# Patient Record
Sex: Female | Born: 1955 | Race: White | Hispanic: No
Health system: Midwestern US, Academic
[De-identification: ages and names within clinical notes are randomized; demographics above are authoritative.]

---

## 2017-09-22 LAB — COMPREHENSIVE METABOLIC PANEL
Lab: 0.5
Lab: 203 — ABNORMAL HIGH (ref 40–150)
Lab: 26
Lab: 4
Lab: 50
Lab: 6.6

## 2017-09-22 LAB — LIPID PROFILE: Lab: 5

## 2018-02-15 LAB — BASIC METABOLIC PANEL
Lab: 101
Lab: 135 — ABNORMAL LOW (ref 137–144)
Lab: 14
Lab: 21 — ABNORMAL LOW (ref 23–31)
Lab: 4.1

## 2018-02-18 LAB — CBC: Lab: 13

## 2018-03-29 ENCOUNTER — Encounter: Admit: 2018-03-29 | Discharge: 2018-03-29 | Payer: MEDICARE

## 2018-03-30 ENCOUNTER — Encounter: Admit: 2018-03-30 | Discharge: 2018-03-30 | Payer: MEDICARE

## 2018-04-01 ENCOUNTER — Encounter: Admit: 2018-04-01 | Discharge: 2018-04-01 | Payer: MEDICARE

## 2018-04-01 DIAGNOSIS — R931 Abnormal findings on diagnostic imaging of heart and coronary circulation: Secondary | ICD-10-CM

## 2018-04-04 ENCOUNTER — Encounter: Admit: 2018-04-04 | Discharge: 2018-04-04 | Payer: MEDICARE

## 2018-04-04 DIAGNOSIS — M549 Dorsalgia, unspecified: Secondary | ICD-10-CM

## 2018-04-04 DIAGNOSIS — R32 Unspecified urinary incontinence: Secondary | ICD-10-CM

## 2018-04-04 DIAGNOSIS — G35 Multiple sclerosis: Secondary | ICD-10-CM

## 2018-04-04 DIAGNOSIS — M1990 Unspecified osteoarthritis, unspecified site: Secondary | ICD-10-CM

## 2018-04-04 DIAGNOSIS — F329 Major depressive disorder, single episode, unspecified: Secondary | ICD-10-CM

## 2018-04-04 DIAGNOSIS — G4700 Insomnia, unspecified: Secondary | ICD-10-CM

## 2018-04-04 DIAGNOSIS — R931 Abnormal findings on diagnostic imaging of heart and coronary circulation: Secondary | ICD-10-CM

## 2018-04-04 DIAGNOSIS — G2581 Restless legs syndrome: Secondary | ICD-10-CM

## 2018-04-04 DIAGNOSIS — H269 Unspecified cataract: Secondary | ICD-10-CM

## 2018-04-04 DIAGNOSIS — K219 Gastro-esophageal reflux disease without esophagitis: Secondary | ICD-10-CM

## 2018-04-04 DIAGNOSIS — E039 Hypothyroidism, unspecified: Secondary | ICD-10-CM

## 2018-04-07 ENCOUNTER — Encounter: Admit: 2018-04-07 | Discharge: 2018-04-07 | Payer: MEDICARE

## 2018-04-07 ENCOUNTER — Ambulatory Visit: Admit: 2018-04-07 | Discharge: 2018-04-07 | Payer: MEDICARE

## 2018-04-07 DIAGNOSIS — H269 Unspecified cataract: Secondary | ICD-10-CM

## 2018-04-07 DIAGNOSIS — R931 Abnormal findings on diagnostic imaging of heart and coronary circulation: Secondary | ICD-10-CM

## 2018-04-07 DIAGNOSIS — G2581 Restless legs syndrome: Secondary | ICD-10-CM

## 2018-04-07 DIAGNOSIS — G4700 Insomnia, unspecified: Secondary | ICD-10-CM

## 2018-04-07 DIAGNOSIS — R32 Unspecified urinary incontinence: Secondary | ICD-10-CM

## 2018-04-07 DIAGNOSIS — M549 Dorsalgia, unspecified: Secondary | ICD-10-CM

## 2018-04-07 DIAGNOSIS — G35 Multiple sclerosis: Secondary | ICD-10-CM

## 2018-04-07 DIAGNOSIS — E039 Hypothyroidism, unspecified: Secondary | ICD-10-CM

## 2018-04-07 DIAGNOSIS — F329 Major depressive disorder, single episode, unspecified: Secondary | ICD-10-CM

## 2018-04-07 DIAGNOSIS — M1990 Unspecified osteoarthritis, unspecified site: Secondary | ICD-10-CM

## 2018-04-07 DIAGNOSIS — K219 Gastro-esophageal reflux disease without esophagitis: Secondary | ICD-10-CM

## 2018-04-08 ENCOUNTER — Encounter: Admit: 2018-04-08 | Discharge: 2018-04-08 | Payer: MEDICARE

## 2018-04-13 ENCOUNTER — Ambulatory Visit: Admit: 2018-04-13 | Discharge: 2018-04-14 | Payer: MEDICARE

## 2018-04-13 DIAGNOSIS — R931 Abnormal findings on diagnostic imaging of heart and coronary circulation: Secondary | ICD-10-CM

## 2018-04-13 MED ORDER — SODIUM CHLORIDE 0.9 % IV SOLP
250 mL | INTRAVENOUS | 0 refills | Status: AC | PRN
Start: 2018-04-13 — End: ?

## 2018-04-13 MED ORDER — AMINOPHYLLINE 500 MG/20 ML IV SOLN
50 mg | INTRAVENOUS | 0 refills | Status: AC | PRN
Start: 2018-04-13 — End: ?

## 2018-04-13 MED ORDER — REGADENOSON 0.4 MG/5 ML IV SYRG
.4 mg | Freq: Once | INTRAVENOUS | 0 refills | Status: CP
Start: 2018-04-13 — End: ?

## 2018-04-26 ENCOUNTER — Encounter: Admit: 2018-04-26 | Discharge: 2018-04-26 | Payer: MEDICARE

## 2018-04-26 NOTE — Telephone Encounter
Spoke with pt and she was given the information as below.

## 2018-04-26 NOTE — Telephone Encounter
-----   Message from Huel Coventry, RN sent at 04/26/2018 10:54 AM CST -----    ----- Message -----  From: Laurence Aly, MD  Sent: 04/25/2018   6:14 PM CST  To: Renetta Chalk, RN, Huel Coventry, RN    Let pt know this looks normal and be sure we have follow up arranged.

## 2018-06-24 ENCOUNTER — Encounter: Admit: 2018-06-24 | Discharge: 2018-06-24 | Payer: MEDICARE

## 2018-06-29 ENCOUNTER — Encounter: Admit: 2018-06-29 | Discharge: 2018-06-29 | Payer: MEDICARE

## 2018-06-30 ENCOUNTER — Encounter: Admit: 2018-06-30 | Discharge: 2018-06-30 | Payer: MEDICARE

## 2018-07-01 ENCOUNTER — Encounter: Admit: 2018-07-01 | Discharge: 2018-07-01 | Payer: MEDICARE

## 2018-07-01 DIAGNOSIS — R32 Unspecified urinary incontinence: ICD-10-CM

## 2018-07-01 DIAGNOSIS — F329 Major depressive disorder, single episode, unspecified: ICD-10-CM

## 2018-07-01 DIAGNOSIS — I2510 Atherosclerotic heart disease of native coronary artery without angina pectoris: ICD-10-CM

## 2018-07-01 DIAGNOSIS — K219 Gastro-esophageal reflux disease without esophagitis: ICD-10-CM

## 2018-07-01 DIAGNOSIS — R931 Abnormal findings on diagnostic imaging of heart and coronary circulation: Principal | ICD-10-CM

## 2018-07-01 DIAGNOSIS — H269 Unspecified cataract: ICD-10-CM

## 2018-07-01 DIAGNOSIS — Z79899 Other long term (current) drug therapy: Secondary | ICD-10-CM

## 2018-07-01 DIAGNOSIS — G35 Multiple sclerosis: ICD-10-CM

## 2018-07-01 DIAGNOSIS — G2581 Restless legs syndrome: ICD-10-CM

## 2018-07-01 DIAGNOSIS — M1990 Unspecified osteoarthritis, unspecified site: ICD-10-CM

## 2018-07-01 DIAGNOSIS — G4700 Insomnia, unspecified: ICD-10-CM

## 2018-07-01 DIAGNOSIS — M549 Dorsalgia, unspecified: ICD-10-CM

## 2018-07-01 DIAGNOSIS — E039 Hypothyroidism, unspecified: ICD-10-CM

## 2018-07-01 DIAGNOSIS — E782 Mixed hyperlipidemia: ICD-10-CM

## 2018-07-01 MED ORDER — PRALUENT PEN 150 MG/ML SC PNIJ
150 mg | SUBCUTANEOUS | 11 refills | 28.00000 days | Status: DC
Start: 2018-07-01 — End: 2019-06-22
  Filled 2018-07-05 (×2): qty 2, 84d supply, fill #1

## 2018-07-01 MED ORDER — REPATHA SURECLICK 140 MG/ML SC PNIJ
140 mg | SUBCUTANEOUS | 11 refills | 28.00000 days | Status: DC
Start: 2018-07-01 — End: 2018-07-01

## 2018-07-02 ENCOUNTER — Ambulatory Visit: Admit: 2018-07-01 | Discharge: 2018-07-02 | Payer: MEDICARE

## 2018-07-02 DIAGNOSIS — Z7982 Long term (current) use of aspirin: ICD-10-CM

## 2018-07-02 DIAGNOSIS — I2584 Coronary atherosclerosis due to calcified coronary lesion: Secondary | ICD-10-CM

## 2018-07-02 DIAGNOSIS — Z720 Tobacco use: ICD-10-CM

## 2018-07-02 DIAGNOSIS — R931 Abnormal findings on diagnostic imaging of heart and coronary circulation: ICD-10-CM

## 2018-07-02 DIAGNOSIS — I2510 Atherosclerotic heart disease of native coronary artery without angina pectoris: Principal | ICD-10-CM

## 2018-07-04 ENCOUNTER — Encounter: Admit: 2018-07-04 | Discharge: 2018-07-04 | Payer: MEDICARE

## 2018-07-04 NOTE — Progress Notes
The Prior Authorization for Praluent was submitted for Nicole Hopkins via [facility].  Will continue to follow.    Maree Erie  Pharmacy Patient Advocate  [phone]

## 2018-07-04 NOTE — Telephone Encounter
Allergy symptoms.   cholecalciferol (VITAMIN D-3) 2,000 unit tablet Take 2,000 Units by mouth daily.   fingolimod (GILENYA) 0.5 mg Take 0.5 mg by mouth daily.   fluticasone propionate (FLONASE) 50 mcg/actuation nasal spray, suspension Apply 2 sprays to each nostril as directed as Needed. Shake bottle gently before using.    hydroCHLOROthiazide (HYDRODIURIL) 12.5 mg tablet Take 12.5 mg by mouth every morning.   levothyroxine (SYNTHROID) 150 mcg tablet Take 150 mcg by mouth daily 30 minutes before breakfast.   lisinopril (PRINIVIL; ZESTRIL) 20 mg tablet Take 20 mg by mouth daily.   MULTIVITAMIN PO Take 1 tablet by mouth daily.   pantoprazole DR (PROTONIX) 40 mg tablet Take 40 mg by mouth daily.   pramipexole (MIRAPEX) 0.5 mg tablet Take 0.5 mg by mouth three times daily.        Drug-drug and drug-food interactions with the new therapy were assessed and reviewed with the patient. The patient was instructed to speak with their health care provider before starting any new drug, including prescription or over the counter, natural products, or vitamins.    Pregnancy potential was reviewed with the patient. Female, not of child-bearing potential: education not applicable.     The monitoring and follow-up plan was discussed with the patient. The patient was instructed to contact their health care provider if their symptoms or health problems do not get better or if they become worse. MACHEL VIOLANTE was instructed to contact the clinical assessment pharmacy at [phone] if they have any questions or concerns regarding their medication therapy.     Based on the patients' preference the medication(s) will be picked up or shipped from [facility].     EYVA CALIFANO was given the opportunity to ask questions and did not have any questions at this time.    Ladona Mow, PHARMD

## 2018-07-22 ENCOUNTER — Encounter: Admit: 2018-07-22 | Discharge: 2018-07-22 | Payer: MEDICARE

## 2018-09-12 ENCOUNTER — Encounter: Admit: 2018-09-12 | Discharge: 2018-09-12

## 2018-09-12 MED FILL — PRALUENT PEN 150 MG/ML SC PNIJ: 150 mg/mL | SUBCUTANEOUS | 84 days supply | Qty: 2 | Fill #2 | Status: AC

## 2018-11-30 ENCOUNTER — Encounter: Admit: 2018-11-30 | Discharge: 2018-11-30

## 2018-12-01 MED FILL — PRALUENT PEN 150 MG/ML SC PNIJ: 150 mg/mL | SUBCUTANEOUS | 84 days supply | Qty: 2 | Fill #3 | Status: AC

## 2019-01-12 ENCOUNTER — Encounter: Admit: 2019-01-12 | Discharge: 2019-01-12 | Payer: MEDICARE

## 2019-01-12 NOTE — Progress Notes
Records Request    Medical records request for continuation of care:    Patient has appointment on 01/18/19   with Dr. Aris Georgia.    Please fax records to [facility]    Request records:    Recent Labs (lipid)     Thank you,      Cardiovascular Medicine  [facility]  [address] [place] [address]  Phone:  [phone]  Fax:  [phone]

## 2019-01-26 ENCOUNTER — Encounter: Admit: 2019-01-26 | Discharge: 2019-01-26 | Payer: MEDICARE

## 2019-01-26 DIAGNOSIS — R609 Edema, unspecified: Secondary | ICD-10-CM

## 2019-01-26 DIAGNOSIS — R931 Abnormal findings on diagnostic imaging of heart and coronary circulation: Secondary | ICD-10-CM

## 2019-01-26 DIAGNOSIS — I2510 Atherosclerotic heart disease of native coronary artery without angina pectoris: Secondary | ICD-10-CM

## 2019-01-26 DIAGNOSIS — I10 Essential (primary) hypertension: Secondary | ICD-10-CM

## 2019-01-26 DIAGNOSIS — G2581 Restless legs syndrome: Secondary | ICD-10-CM

## 2019-01-26 NOTE — Telephone Encounter
-----   Message from Rosaria Ferries, LPN sent at 32/11/1914 11:55 AM CST -----  Regarding: Echo / Appt with TLR  Patient called today 01/26/2019 requesting to have an Echocardiogram. Patient stated that PCP / Fidela Juneau PA would like for patient to have Echo due to leg swelling. Patient is scheduled to follow up with Dr. Fabio Neighbors on 02/06/2019 at [place]. No current order for Echo is placed in patient's chart. Thank you!

## 2019-01-26 NOTE — Telephone Encounter
Reviewed with TLR orders received for echo

## 2019-01-30 ENCOUNTER — Encounter: Admit: 2019-01-30 | Discharge: 2019-01-30 | Payer: MEDICARE

## 2019-02-01 ENCOUNTER — Encounter: Admit: 2019-02-01 | Discharge: 2019-02-01 | Payer: MEDICARE

## 2019-02-01 DIAGNOSIS — E782 Mixed hyperlipidemia: Secondary | ICD-10-CM

## 2019-02-03 ENCOUNTER — Encounter: Admit: 2019-02-03 | Discharge: 2019-02-03 | Payer: MEDICARE

## 2019-02-03 NOTE — Telephone Encounter
Results sent through MyChart.

## 2019-02-03 NOTE — Telephone Encounter
[place], Nicole Nottingham, APRN-NP  Alfonse Ras, RN        This is amazing! Please let her know how much progress we have made!

## 2019-02-06 ENCOUNTER — Encounter: Admit: 2019-02-06 | Discharge: 2019-02-06 | Payer: MEDICARE

## 2019-02-06 ENCOUNTER — Ambulatory Visit: Admit: 2019-02-06 | Discharge: 2019-02-06 | Payer: MEDICARE

## 2019-02-06 DIAGNOSIS — M549 Dorsalgia, unspecified: Secondary | ICD-10-CM

## 2019-02-06 DIAGNOSIS — G35 Multiple sclerosis: Secondary | ICD-10-CM

## 2019-02-06 DIAGNOSIS — G4700 Insomnia, unspecified: Secondary | ICD-10-CM

## 2019-02-06 DIAGNOSIS — G2581 Restless legs syndrome: Secondary | ICD-10-CM

## 2019-02-06 DIAGNOSIS — I2510 Atherosclerotic heart disease of native coronary artery without angina pectoris: Secondary | ICD-10-CM

## 2019-02-06 DIAGNOSIS — M1990 Unspecified osteoarthritis, unspecified site: Secondary | ICD-10-CM

## 2019-02-06 DIAGNOSIS — F329 Major depressive disorder, single episode, unspecified: Secondary | ICD-10-CM

## 2019-02-06 DIAGNOSIS — R931 Abnormal findings on diagnostic imaging of heart and coronary circulation: Secondary | ICD-10-CM

## 2019-02-06 DIAGNOSIS — I10 Essential (primary) hypertension: Secondary | ICD-10-CM

## 2019-02-06 DIAGNOSIS — E782 Mixed hyperlipidemia: Secondary | ICD-10-CM

## 2019-02-06 DIAGNOSIS — E039 Hypothyroidism, unspecified: Secondary | ICD-10-CM

## 2019-02-06 DIAGNOSIS — R32 Unspecified urinary incontinence: Secondary | ICD-10-CM

## 2019-02-06 DIAGNOSIS — R609 Edema, unspecified: Secondary | ICD-10-CM

## 2019-02-06 DIAGNOSIS — H269 Unspecified cataract: Secondary | ICD-10-CM

## 2019-02-06 DIAGNOSIS — K219 Gastro-esophageal reflux disease without esophagitis: Secondary | ICD-10-CM

## 2019-02-06 MED ORDER — PERFLUTREN LIPID MICROSPHERES 1.1 MG/ML IV SUSP
1-20 mL | Freq: Once | INTRAVENOUS | 0 refills | Status: CP | PRN
Start: 2019-02-06 — End: ?

## 2019-02-10 ENCOUNTER — Encounter: Admit: 2019-02-10 | Discharge: 2019-02-10 | Payer: MEDICARE

## 2019-02-10 NOTE — Telephone Encounter
02/10/2019 10:57 AM Patient called to provide update on daily weights after Lasix was increased from 40 mg daily to 80 mg daily X 3 days at [facility] on 02/06/19 with Dr. Aris Georgia.  Potassium was also increased to 40 mEq X 3 days.  Today, patient confirms she is back on her prescribed dose of Lasix 40 mEq daily and potassium 20 mEq daily.  At [place], patient was up 3-5 lbs from her baseline weight and had 1+ edema in BLE.    Daily home weights:    11/16 182.8  (186 lb, 6.4 oz in clinic)  11/17 181.8  11/18 180.8  11/19 180.4  11/20 180.1    Patient lost almost three pounds on the increased dose of Lasix.  She reports her swelling is resolved.  Her leg wraps are now replaced by compression stockings.    Reviewed all above with Dr. Aris Georgia.  Will continue to monitor weights.  Asked patient to keep a BP and HR log, along with daily weights.  Call back in two weeks with readings.    Called patent back to relay Dr. Loletta Parish recommendations.  Patient's BP monitor is not working.  Her mother will stop by the clinic after work to pick up a new one.  Patient will take her blood pressure 1-2 hours after morning medications and after sitting/resting for 10-15 mintues.  Patient will call in two weeks with BP, HR, and daily weights.

## 2019-02-14 ENCOUNTER — Encounter: Admit: 2019-02-14 | Discharge: 2019-02-14 | Payer: MEDICARE

## 2019-02-14 NOTE — Telephone Encounter
Results and recommendations called to patient.

## 2019-02-14 NOTE — Telephone Encounter
-----   Message from Binnie Kand, RN sent at 02/13/2019  6:16 PM CST -----    ----- Message -----  From: Shan Levans, MD  Sent: 02/11/2019   8:44 AM CST  To: Binnie Kand, RN    LET PT KNOW THI SLOOKS NORMAL.

## 2019-03-03 ENCOUNTER — Encounter: Admit: 2019-03-03 | Discharge: 2019-03-03 | Payer: MEDICARE

## 2019-03-06 ENCOUNTER — Encounter: Admit: 2019-03-06 | Discharge: 2019-03-06 | Payer: MEDICARE

## 2019-03-07 ENCOUNTER — Encounter: Admit: 2019-03-07 | Discharge: 2019-03-07 | Payer: MEDICARE

## 2019-03-07 DIAGNOSIS — I2510 Atherosclerotic heart disease of native coronary artery without angina pectoris: Secondary | ICD-10-CM

## 2019-03-07 DIAGNOSIS — R600 Localized edema: Secondary | ICD-10-CM

## 2019-03-07 MED ORDER — FUROSEMIDE 40 MG PO TAB
ORAL_TABLET | Freq: Every day | ORAL | 3 refills | 90.00000 days | Status: DC
Start: 2019-03-07 — End: 2019-08-16

## 2019-03-07 MED FILL — PRALUENT PEN 150 MG/ML SC PNIJ: 150 mg/mL | SUBCUTANEOUS | 84 days supply | Qty: 2 | Fill #4 | Status: AC

## 2019-03-07 NOTE — Telephone Encounter
Patient called with concerns of lower leg edema. Patient saw TLR on 02-06-2019 in clinic with similar symptoms. At that time she was directed to take Lasix 80mg  daily and potassium 40 meq daily for 3 days. Patient would like to know if she can do this again.   TLR consulted via phone and he agrees with this plan. He would also like to have BNP and BMP drawn in a few days.   Orders entered and patient notified.

## 2019-03-10 ENCOUNTER — Encounter: Admit: 2019-03-10 | Discharge: 2019-03-10 | Payer: MEDICARE

## 2019-03-10 DIAGNOSIS — I2510 Atherosclerotic heart disease of native coronary artery without angina pectoris: Secondary | ICD-10-CM

## 2019-03-10 DIAGNOSIS — R600 Localized edema: Secondary | ICD-10-CM

## 2019-03-12 ENCOUNTER — Encounter: Admit: 2019-03-12 | Discharge: 2019-03-12 | Payer: MEDICARE

## 2019-04-06 ENCOUNTER — Encounter: Admit: 2019-04-06 | Discharge: 2019-04-06 | Payer: MEDICARE

## 2019-04-07 ENCOUNTER — Encounter: Admit: 2019-04-07 | Discharge: 2019-04-07 | Payer: MEDICARE

## 2019-04-11 ENCOUNTER — Encounter: Admit: 2019-04-11 | Discharge: 2019-04-11 | Payer: MEDICARE

## 2019-04-11 MED FILL — PRALUENT PEN 150 MG/ML SC PNIJ: 150 mg/mL | SUBCUTANEOUS | 28 days supply | Qty: 2 | Fill #5 | Status: AC

## 2019-05-05 ENCOUNTER — Encounter: Admit: 2019-05-05 | Discharge: 2019-05-05 | Payer: MEDICARE

## 2019-05-08 ENCOUNTER — Encounter: Admit: 2019-05-08 | Discharge: 2019-05-08 | Payer: MEDICARE

## 2019-05-09 ENCOUNTER — Encounter: Admit: 2019-05-09 | Discharge: 2019-05-09 | Payer: MEDICARE

## 2019-05-25 ENCOUNTER — Encounter: Admit: 2019-05-25 | Discharge: 2019-05-25 | Payer: MEDICARE

## 2019-06-05 ENCOUNTER — Encounter: Admit: 2019-06-05 | Discharge: 2019-06-05 | Payer: MEDICARE

## 2019-06-05 MED FILL — PRALUENT PEN 150 MG/ML SC PNIJ: 150 mg/mL | SUBCUTANEOUS | 28 days supply | Qty: 2 | Fill #6 | Status: AC

## 2019-06-14 ENCOUNTER — Encounter: Admit: 2019-06-14 | Discharge: 2019-06-14 | Payer: MEDICARE

## 2019-06-22 ENCOUNTER — Encounter: Admit: 2019-06-22 | Discharge: 2019-06-22 | Payer: MEDICARE

## 2019-06-22 MED ORDER — PRALUENT PEN 150 MG/ML SC PNIJ
150 mg | SUBCUTANEOUS | 11 refills | 28.00000 days | Status: AC
Start: 2019-06-22 — End: ?
  Filled 2019-07-06: qty 2, 28d supply, fill #1

## 2019-06-23 ENCOUNTER — Encounter: Admit: 2019-06-23 | Discharge: 2019-06-23 | Payer: MEDICARE

## 2019-07-06 ENCOUNTER — Encounter: Admit: 2019-07-06 | Discharge: 2019-07-06 | Payer: MEDICARE

## 2019-08-08 ENCOUNTER — Encounter: Admit: 2019-08-08 | Discharge: 2019-08-08 | Payer: MEDICARE

## 2019-08-09 MED FILL — PRALUENT PEN 150 MG/ML SC PNIJ: 150 mg/mL | SUBCUTANEOUS | 28 days supply | Qty: 2 | Fill #2 | Status: AC

## 2019-08-16 ENCOUNTER — Encounter: Admit: 2019-08-16 | Discharge: 2019-08-16 | Payer: MEDICARE

## 2019-08-16 DIAGNOSIS — G4700 Insomnia, unspecified: Secondary | ICD-10-CM

## 2019-08-16 DIAGNOSIS — I2510 Atherosclerotic heart disease of native coronary artery without angina pectoris: Secondary | ICD-10-CM

## 2019-08-16 DIAGNOSIS — E782 Mixed hyperlipidemia: Secondary | ICD-10-CM

## 2019-08-16 DIAGNOSIS — I10 Essential (primary) hypertension: Secondary | ICD-10-CM

## 2019-08-16 DIAGNOSIS — M1990 Unspecified osteoarthritis, unspecified site: Secondary | ICD-10-CM

## 2019-08-16 DIAGNOSIS — M549 Dorsalgia, unspecified: Secondary | ICD-10-CM

## 2019-08-16 DIAGNOSIS — K219 Gastro-esophageal reflux disease without esophagitis: Secondary | ICD-10-CM

## 2019-08-16 DIAGNOSIS — R931 Abnormal findings on diagnostic imaging of heart and coronary circulation: Secondary | ICD-10-CM

## 2019-08-16 DIAGNOSIS — E039 Hypothyroidism, unspecified: Secondary | ICD-10-CM

## 2019-08-16 DIAGNOSIS — R32 Unspecified urinary incontinence: Secondary | ICD-10-CM

## 2019-08-16 DIAGNOSIS — G2581 Restless legs syndrome: Secondary | ICD-10-CM

## 2019-08-16 DIAGNOSIS — F329 Major depressive disorder, single episode, unspecified: Secondary | ICD-10-CM

## 2019-08-16 DIAGNOSIS — G35 Multiple sclerosis: Secondary | ICD-10-CM

## 2019-08-16 DIAGNOSIS — H269 Unspecified cataract: Secondary | ICD-10-CM

## 2019-08-16 MED ORDER — FUROSEMIDE 80 MG PO TAB
80 mg | ORAL_TABLET | Freq: Every day | ORAL | 3 refills | 90.00000 days | Status: AC
Start: 2019-08-16 — End: ?

## 2019-08-16 MED ORDER — POTASSIUM CHLORIDE 20 MEQ PO TBER
40 meq | ORAL_TABLET | Freq: Every day | ORAL | 3 refills | 30.00000 days | Status: AC
Start: 2019-08-16 — End: ?

## 2019-08-16 NOTE — Patient Instructions
It was a pleasure to see you in clinic today.  Please call our office at [phone] with any questions or concerns.    Changes today:  Increase Lasix to 80mg  daily  Increase Potassium Chloride to daily    Ordered testing:  BMP, BNP to completed next week    Follow-up:  Dr. Geronimo Boot will follow up with you in 6 months

## 2019-09-13 ENCOUNTER — Encounter: Admit: 2019-09-13 | Discharge: 2019-09-13 | Payer: MEDICARE

## 2019-09-13 DIAGNOSIS — I10 Essential (primary) hypertension: Secondary | ICD-10-CM

## 2019-09-13 DIAGNOSIS — E782 Mixed hyperlipidemia: Secondary | ICD-10-CM

## 2019-09-13 DIAGNOSIS — I2510 Atherosclerotic heart disease of native coronary artery without angina pectoris: Secondary | ICD-10-CM

## 2019-09-14 ENCOUNTER — Encounter: Admit: 2019-09-14 | Discharge: 2019-09-14 | Payer: MEDICARE

## 2019-09-14 MED FILL — PRALUENT PEN 150 MG/ML SC PNIJ: 150 mg/mL | SUBCUTANEOUS | 28 days supply | Qty: 2 | Fill #3 | Status: AC

## 2019-10-11 ENCOUNTER — Encounter: Admit: 2019-10-11 | Discharge: 2019-10-11 | Payer: MEDICARE

## 2019-10-11 MED FILL — PRALUENT PEN 150 MG/ML SC PNIJ: 150 mg/mL | SUBCUTANEOUS | 28 days supply | Qty: 2 | Fill #4 | Status: AC

## 2019-11-07 ENCOUNTER — Encounter: Admit: 2019-11-07 | Discharge: 2019-11-07 | Payer: MEDICARE

## 2019-11-13 ENCOUNTER — Encounter: Admit: 2019-11-13 | Discharge: 2019-11-13 | Payer: MEDICARE

## 2019-11-13 MED ORDER — METOLAZONE 2.5 MG PO TAB
2.5 mg | ORAL_TABLET | Freq: Every day | ORAL | 0 refills | 84.00000 days | Status: AC
Start: 2019-11-13 — End: ?

## 2019-11-13 MED ORDER — POTASSIUM CHLORIDE 20 MEQ PO TBER
40 meq | ORAL_TABLET | Freq: Every day | ORAL | 3 refills | 30.00000 days | Status: AC
Start: 2019-11-13 — End: ?

## 2019-11-14 ENCOUNTER — Encounter: Admit: 2019-11-14 | Discharge: 2019-11-14 | Payer: MEDICARE

## 2019-11-15 ENCOUNTER — Encounter: Admit: 2019-11-15 | Discharge: 2019-11-15 | Payer: MEDICARE

## 2019-11-15 MED FILL — PRALUENT PEN 150 MG/ML SC PNIJ: 150 mg/mL | SUBCUTANEOUS | 28 days supply | Qty: 2 | Fill #5 | Status: AC

## 2019-12-05 ENCOUNTER — Encounter: Admit: 2019-12-05 | Discharge: 2019-12-05 | Payer: MEDICARE

## 2019-12-05 MED ORDER — FUROSEMIDE 80 MG PO TAB
80 mg | ORAL_TABLET | Freq: Every day | ORAL | 3 refills | 90.00000 days | Status: AC
Start: 2019-12-05 — End: ?

## 2019-12-18 ENCOUNTER — Encounter: Admit: 2019-12-18 | Discharge: 2019-12-18 | Payer: MEDICARE

## 2019-12-18 MED FILL — PRALUENT PEN 150 MG/ML SC PNIJ: 150 mg/mL | SUBCUTANEOUS | 28 days supply | Qty: 2 | Fill #6 | Status: AC

## 2019-12-22 ENCOUNTER — Encounter: Admit: 2019-12-22 | Discharge: 2019-12-22 | Payer: MEDICARE

## 2019-12-22 DIAGNOSIS — R609 Edema, unspecified: Secondary | ICD-10-CM

## 2019-12-22 MED ORDER — METOLAZONE 2.5 MG PO TAB
2.5 mg | ORAL_TABLET | Freq: Every day | ORAL | 0 refills | 84.00000 days | Status: AC
Start: 2019-12-22 — End: ?

## 2019-12-22 NOTE — Telephone Encounter
Patient called with concerns of increased bilat lower extremity edema. Patient is requesting to resume bumex as she took a couple of months ago when she had swelling in her feet and legs. Patient states at that time she thought the bumex was causing her to have dizziness but now she thinks it was because she was also having sinus problems.     Discussed with TLR in clinic. His recommendations are for bumex 2.5mg  daily for 7 days and BMP in 1 week.     Discussed recommendations with patient. Patient verbalized understanding and agrees with plan.

## 2020-01-18 ENCOUNTER — Encounter: Admit: 2020-01-18 | Discharge: 2020-01-18 | Payer: MEDICARE

## 2020-01-29 ENCOUNTER — Encounter: Admit: 2020-01-29 | Discharge: 2020-01-29 | Payer: MEDICARE

## 2020-01-29 MED FILL — PRALUENT PEN 150 MG/ML SC PNIJ: 150 mg/mL | SUBCUTANEOUS | 28 days supply | Qty: 2 | Fill #7 | Status: AC

## 2020-04-05 ENCOUNTER — Encounter: Admit: 2020-04-05 | Discharge: 2020-04-05 | Payer: MEDICARE | Primary: Primary Care

## 2020-04-05 DIAGNOSIS — R32 Unspecified urinary incontinence: Secondary | ICD-10-CM

## 2020-04-05 DIAGNOSIS — R931 Abnormal findings on diagnostic imaging of heart and coronary circulation: Secondary | ICD-10-CM

## 2020-04-05 DIAGNOSIS — G2581 Restless legs syndrome: Secondary | ICD-10-CM

## 2020-04-05 DIAGNOSIS — E039 Hypothyroidism, unspecified: Secondary | ICD-10-CM

## 2020-04-05 DIAGNOSIS — K219 Gastro-esophageal reflux disease without esophagitis: Secondary | ICD-10-CM

## 2020-04-05 DIAGNOSIS — G35 Multiple sclerosis: Secondary | ICD-10-CM

## 2020-04-05 DIAGNOSIS — G4700 Insomnia, unspecified: Secondary | ICD-10-CM

## 2020-04-05 DIAGNOSIS — M1990 Unspecified osteoarthritis, unspecified site: Secondary | ICD-10-CM

## 2020-04-05 DIAGNOSIS — E782 Mixed hyperlipidemia: Secondary | ICD-10-CM

## 2020-04-05 DIAGNOSIS — I2510 Atherosclerotic heart disease of native coronary artery without angina pectoris: Secondary | ICD-10-CM

## 2020-04-05 DIAGNOSIS — F32A Depression: Secondary | ICD-10-CM

## 2020-04-05 DIAGNOSIS — H269 Unspecified cataract: Secondary | ICD-10-CM

## 2020-04-05 DIAGNOSIS — M549 Dorsalgia, unspecified: Secondary | ICD-10-CM

## 2020-04-05 MED ORDER — POTASSIUM CHLORIDE 20 MEQ PO TBER
40 meq | ORAL_TABLET | Freq: Every day | ORAL | 3 refills | 30.00000 days | Status: AC
Start: 2020-04-05 — End: ?

## 2020-04-10 ENCOUNTER — Encounter: Admit: 2020-04-10 | Discharge: 2020-04-10 | Payer: MEDICARE | Primary: Primary Care

## 2020-04-10 MED FILL — PRALUENT PEN 150 MG/ML SC PNIJ: 150 mg/mL | SUBCUTANEOUS | 28 days supply | Qty: 2 | Fill #8 | Status: AC

## 2020-05-13 MED FILL — PRALUENT PEN 150 MG/ML SC PNIJ: 150 mg/mL | SUBCUTANEOUS | 28 days supply | Qty: 2 | Fill #9 | Status: AC

## 2020-06-17 ENCOUNTER — Encounter: Admit: 2020-06-17 | Discharge: 2020-06-17 | Payer: MEDICARE | Primary: Primary Care

## 2020-06-17 MED FILL — PRALUENT PEN 150 MG/ML SC PNIJ: 150 mg/mL | SUBCUTANEOUS | 28 days supply | Qty: 2 | Fill #10 | Status: AC

## 2020-06-17 NOTE — Progress Notes
Copay assistance of $2500 was obtained for the specialty medication praluent using grant from [facility] and now the copay is $0.  Nicole Hopkins has stated this copay is affordable.  The specialty pharmacy will pursue additional copay assistance as necessary.  The specialty pharmacy will reach out to the ambulatory clinical pharmacist and oral chemotherapy pharmacist if the copay becomes unaffordable.    The medication will be delivered to patient's prescription address per the patient's request.    Monica Becton  Pharmacy Patient Advocate  [phone]

## 2020-06-17 NOTE — Progress Notes
Contacted [facility] to discuss [employer] regarding their medication: praluent.  Left voicemail asking patient to return call to the specialty pharmacy [phone]).  Will call patient again in 2 business days if no call back received.Marland Kitchen    Monica Becton, CPhT  Pharmacy Patient Advocate, Specialty Pharmacy  [phone]

## 2020-08-28 ENCOUNTER — Encounter: Admit: 2020-08-28 | Discharge: 2020-08-28 | Payer: MEDICARE | Primary: Primary Care

## 2020-08-28 MED ORDER — PRALUENT PEN 150 MG/ML SC PNIJ
150 mg | SUBCUTANEOUS | 11 refills | 28.00000 days | Status: AC
Start: 2020-08-28 — End: ?
  Filled 2020-08-29: qty 2, 28d supply, fill #1

## 2020-08-29 IMAGING — MG MAMMOGRAM, DIGITAL SCREEN BILA
1 series · 4 of 4 positions shown · non-contrast
Comparison: none

[Series 2: R CC · right · 4 of 4 slices shown]
[im 1/4]
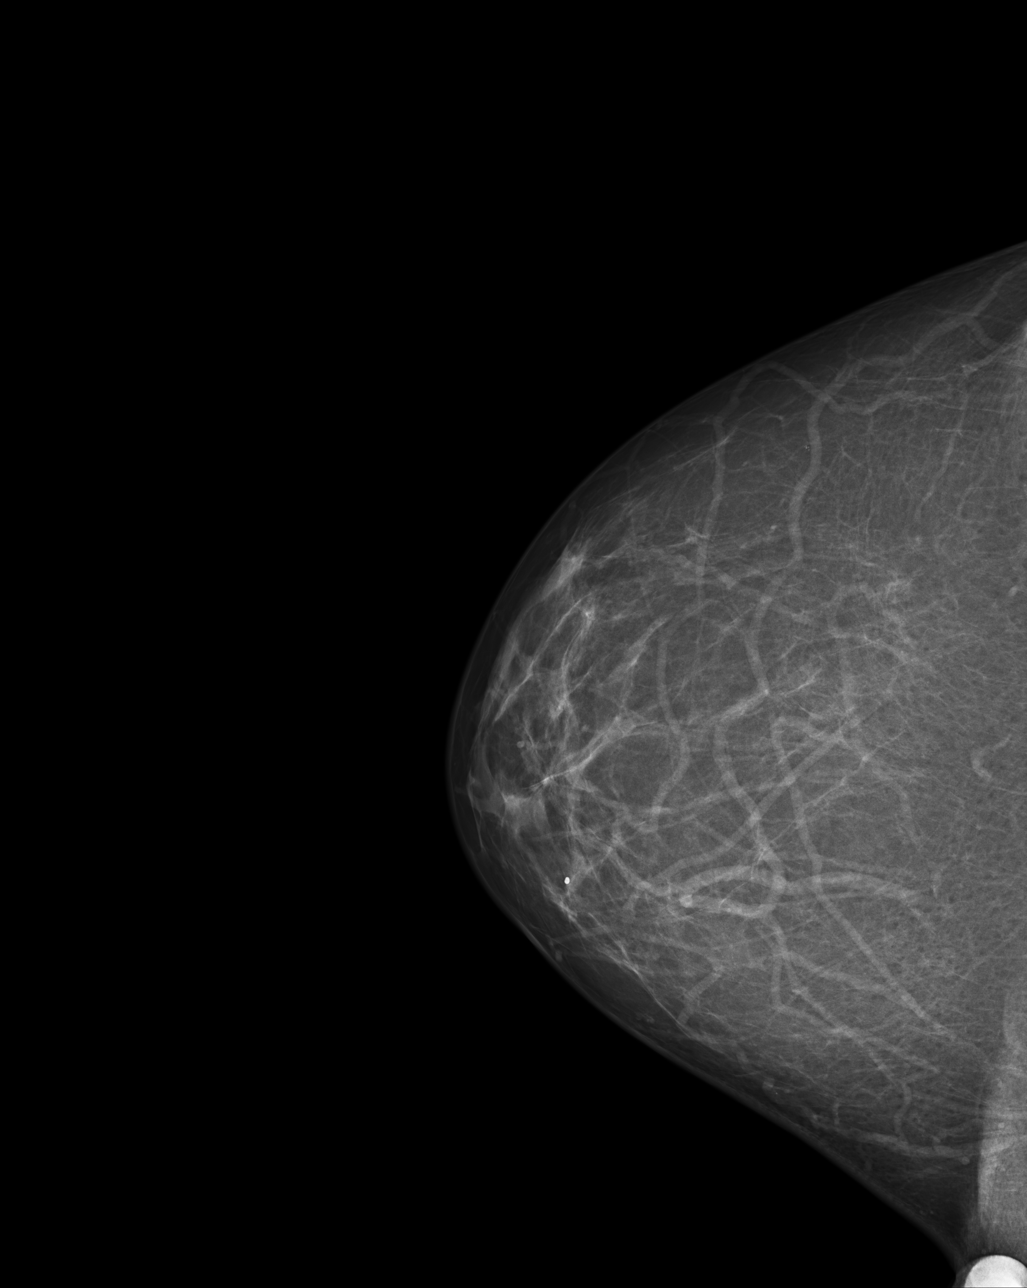
[im 2/4]
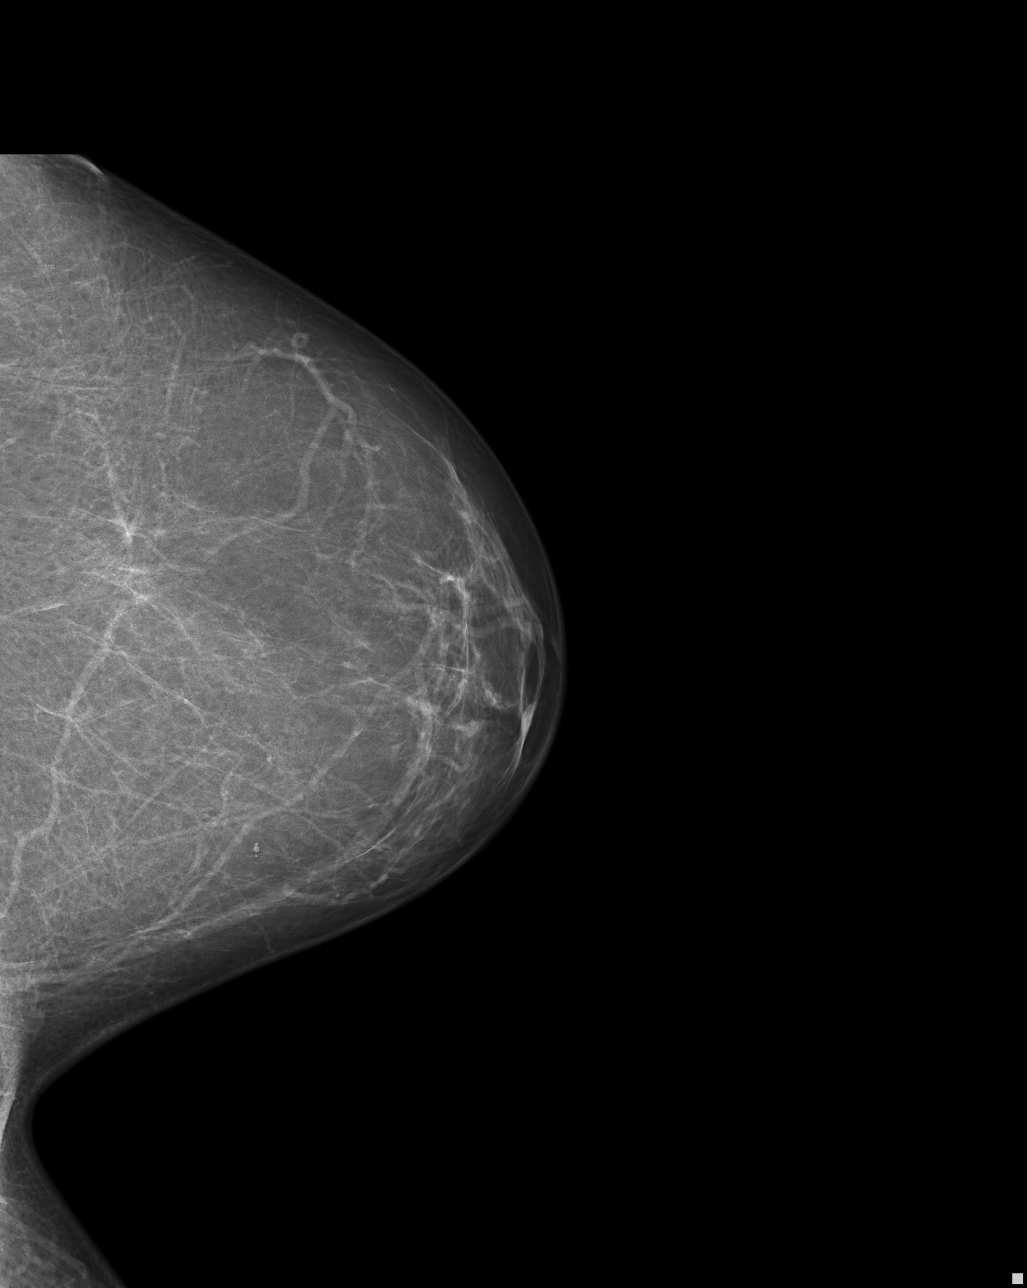
[im 3/4]
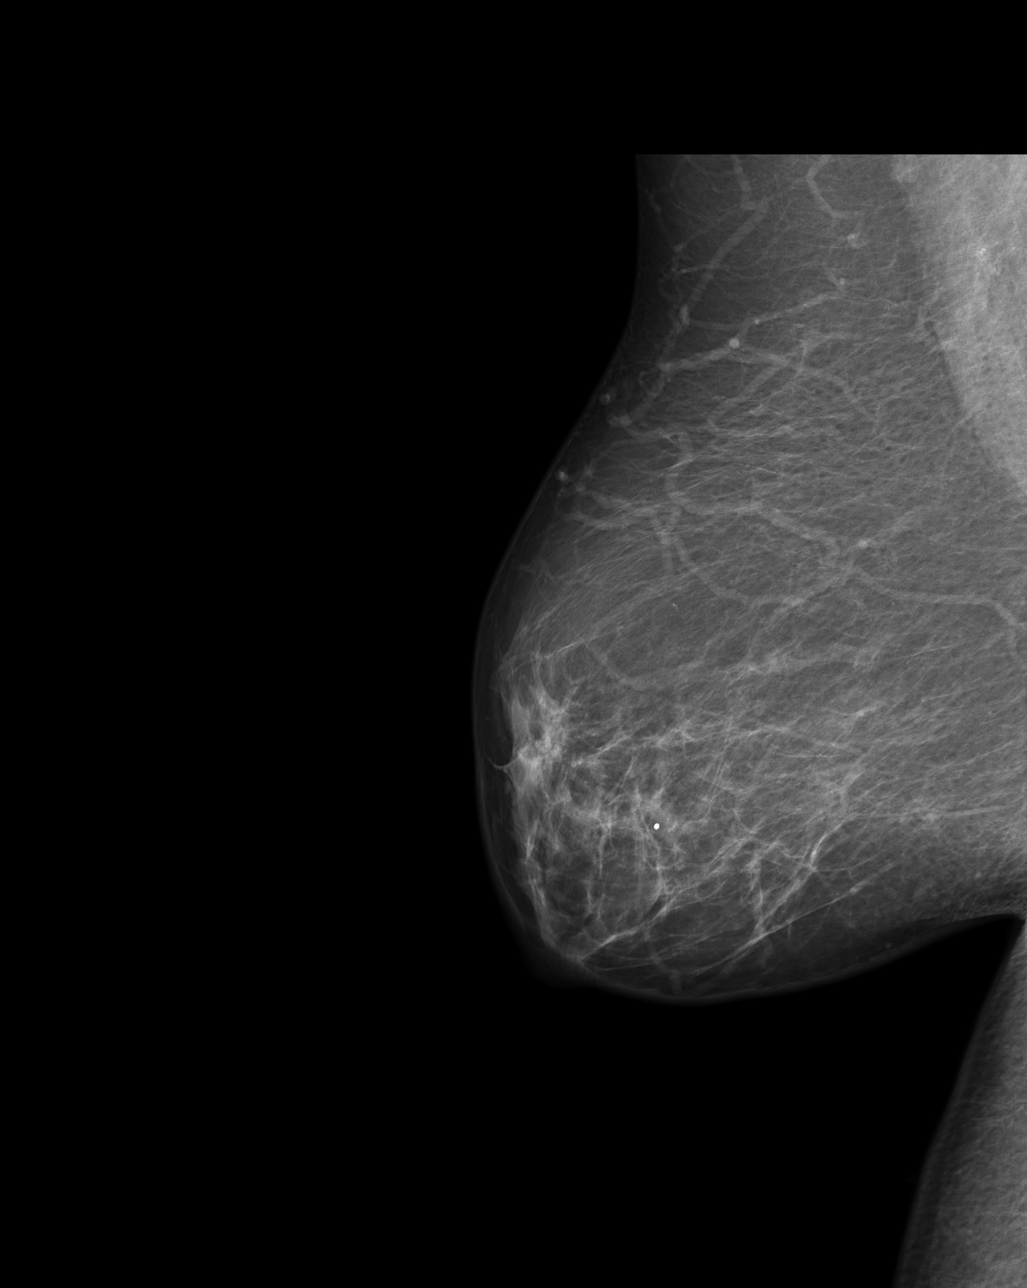
[im 4/4]
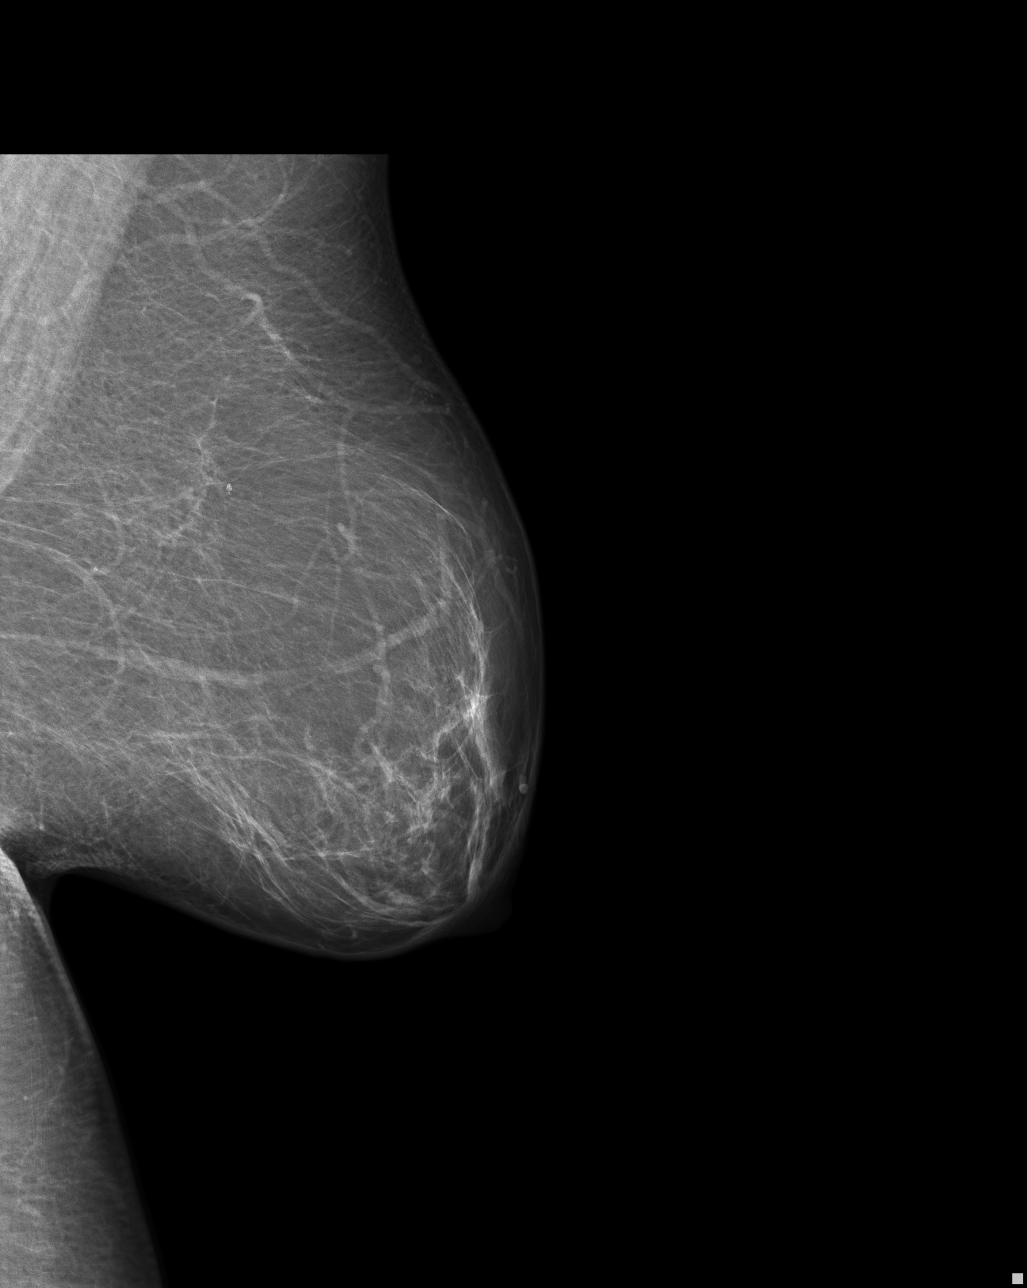

[4 of 4 positions shown; findings below may reference images not displayed]

EXAM
SCREENING MAMMOGRAM, BILATERAL

INDICATION
SCREENING
SKIN CA 2272. SCREENING. AB (2D - LIMITED NECK ROM) PRIORS: 0828, 4996.

TECHNIQUE
Bilateral craniocaudal and medial lateral oblique views were generated and reviewed with computer-
aided detection.

COMPARISONS
August 07, 2016

FINDINGS
ACR Type 2: 25-50% There are scattered fibroglandular densities.
No concerning masses, calcifications, or interval changes are seen.

IMPRESSION
Stable bilateral mammography. One year follow-up is recommended.
BI-RADS 2, BENIGN.

Tech Notes:

## 2020-10-09 ENCOUNTER — Encounter: Admit: 2020-10-09 | Discharge: 2020-10-09 | Payer: MEDICARE | Primary: Primary Care

## 2020-10-09 MED FILL — PRALUENT PEN 150 MG/ML SC PNIJ: 150 mg/mL | SUBCUTANEOUS | 28 days supply | Qty: 2 | Fill #2 | Status: AC

## 2020-10-28 ENCOUNTER — Encounter: Admit: 2020-10-28 | Discharge: 2020-10-28 | Payer: MEDICARE | Primary: Primary Care

## 2020-11-18 ENCOUNTER — Encounter: Admit: 2020-11-18 | Discharge: 2020-11-18 | Payer: MEDICARE | Primary: Primary Care

## 2020-11-19 MED FILL — PRALUENT PEN 150 MG/ML SC PNIJ: 150 mg/mL | SUBCUTANEOUS | 28 days supply | Qty: 2 | Fill #3 | Status: AC

## 2020-12-22 ENCOUNTER — Encounter: Admit: 2020-12-22 | Discharge: 2020-12-22 | Payer: MEDICARE | Primary: Primary Care

## 2020-12-23 MED FILL — PRALUENT PEN 150 MG/ML SC PNIJ: 150 mg/mL | SUBCUTANEOUS | 28 days supply | Qty: 2 | Fill #4 | Status: AC

## 2021-01-07 ENCOUNTER — Encounter: Admit: 2021-01-07 | Discharge: 2021-01-07 | Payer: MEDICARE | Primary: Primary Care

## 2021-01-07 DIAGNOSIS — R931 Abnormal findings on diagnostic imaging of heart and coronary circulation: Secondary | ICD-10-CM

## 2021-01-07 DIAGNOSIS — G2581 Restless legs syndrome: Secondary | ICD-10-CM

## 2021-01-07 DIAGNOSIS — G4700 Insomnia, unspecified: Secondary | ICD-10-CM

## 2021-01-07 DIAGNOSIS — F32A Depression: Secondary | ICD-10-CM

## 2021-01-07 DIAGNOSIS — K219 Gastro-esophageal reflux disease without esophagitis: Secondary | ICD-10-CM

## 2021-01-07 DIAGNOSIS — G35 Multiple sclerosis: Secondary | ICD-10-CM

## 2021-01-07 DIAGNOSIS — E039 Hypothyroidism, unspecified: Secondary | ICD-10-CM

## 2021-01-07 DIAGNOSIS — E782 Mixed hyperlipidemia: Secondary | ICD-10-CM

## 2021-01-07 DIAGNOSIS — I2510 Atherosclerotic heart disease of native coronary artery without angina pectoris: Secondary | ICD-10-CM

## 2021-01-07 DIAGNOSIS — R32 Unspecified urinary incontinence: Secondary | ICD-10-CM

## 2021-01-07 DIAGNOSIS — M549 Dorsalgia, unspecified: Secondary | ICD-10-CM

## 2021-01-07 DIAGNOSIS — M1990 Unspecified osteoarthritis, unspecified site: Secondary | ICD-10-CM

## 2021-01-07 DIAGNOSIS — H269 Unspecified cataract: Secondary | ICD-10-CM

## 2021-01-07 DIAGNOSIS — Z136 Encounter for screening for cardiovascular disorders: Secondary | ICD-10-CM

## 2021-01-07 LAB — BASIC METABOLIC PANEL
ANION GAP: 12
BLD UREA NITROGEN: 13
CALCIUM: 9.4
CHLORIDE: 107
CO2: 19 — ABNORMAL LOW (ref 23–31)
CREATININE: 0.7
GFR ESTIMATED: 85
GLUCOSE,PANEL: 104
POTASSIUM: 4.6
SODIUM: 138

## 2021-01-07 LAB — BNP (B-TYPE NATRIURETIC PEPTI): BNP: 17

## 2021-01-07 MED ORDER — METOLAZONE 2.5 MG PO TAB
2.5 mg | ORAL_TABLET | Freq: Two times a day (BID) | ORAL | 3 refills | 84.00000 days | Status: AC
Start: 2021-01-07 — End: ?

## 2021-01-07 NOTE — Patient Instructions
Thank you for visiting our office today.    We would like to make the following medication adjustments:     **Metolazone 2.5mg  - please have labs drawn prior to starting medication     Otherwise continue the same medications as you have been doing.          We will be pursuing the following tests after your appointment today:       Orders Placed This Encounter    BASIC METABOLIC PANEL    BNP (B-TYPE NATRIURETIC PEPTI)    ECG 12-LEAD    metOLazone (ZAROXOLYN) 2.5 mg tablet         We will plan to see you back in 6 months.  Please call us in the meantime with any questions or concerns.        Please allow 5-7 business days for our providers to review your results. All normal results will go to MyChart. If you do not have Mychart, it is strongly recommended to get this so you can easily view all your results. If you do not have mychart, we will attempt to call you once with normal lab and testing results. If we cannot reach you by phone with normal results, we will send you a letter.  If you have not heard the results of your testing after one week please give [place] a call.       Your Cardiovascular Medicine [place] Team Brett Canales, Pilar Jarvis, Shawna Orleans, and [place])  phone number is [phone].

## 2021-01-08 ENCOUNTER — Encounter: Admit: 2021-01-08 | Discharge: 2021-01-08 | Payer: MEDICARE | Primary: Primary Care

## 2021-01-08 DIAGNOSIS — R931 Abnormal findings on diagnostic imaging of heart and coronary circulation: Secondary | ICD-10-CM

## 2021-01-08 DIAGNOSIS — I2510 Atherosclerotic heart disease of native coronary artery without angina pectoris: Secondary | ICD-10-CM

## 2021-01-08 DIAGNOSIS — E782 Mixed hyperlipidemia: Secondary | ICD-10-CM

## 2021-01-08 NOTE — Telephone Encounter
Reviewed lab results with TLR. Patient is to start Metolazone 2.5mg  daily and have BMP checked after 10 days.       Called and discussed recommendations with patient. Patient is agreeable to care plan and has further questions at this time. Patient is to have labs drawn at [facility]. Lab order faxed.

## 2021-01-12 ENCOUNTER — Encounter: Admit: 2021-01-12 | Discharge: 2021-01-12 | Payer: MEDICARE | Primary: Primary Care

## 2021-01-12 MED ORDER — FUROSEMIDE 80 MG PO TAB
ORAL_TABLET | Freq: Every day | 3 refills
Start: 2021-01-12 — End: ?

## 2021-02-05 ENCOUNTER — Encounter: Admit: 2021-02-05 | Discharge: 2021-02-05 | Payer: MEDICARE | Primary: Primary Care

## 2021-02-05 DIAGNOSIS — R931 Abnormal findings on diagnostic imaging of heart and coronary circulation: Secondary | ICD-10-CM

## 2021-02-05 DIAGNOSIS — I2510 Atherosclerotic heart disease of native coronary artery without angina pectoris: Secondary | ICD-10-CM

## 2021-02-05 DIAGNOSIS — E782 Mixed hyperlipidemia: Secondary | ICD-10-CM

## 2021-02-22 ENCOUNTER — Encounter: Admit: 2021-02-22 | Discharge: 2021-02-22 | Payer: MEDICARE | Primary: Primary Care

## 2021-02-24 MED FILL — PRALUENT PEN 150 MG/ML SC PNIJ: 150 mg/mL | SUBCUTANEOUS | 28 days supply | Qty: 2 | Fill #5 | Status: AC

## 2021-03-21 ENCOUNTER — Encounter: Admit: 2021-03-21 | Discharge: 2021-03-21 | Payer: MEDICARE | Primary: Primary Care

## 2021-03-21 MED ORDER — POTASSIUM CHLORIDE 20 MEQ PO TBER
ORAL_TABLET | Freq: Every day | 3 refills | 30.00000 days | Status: AC
Start: 2021-03-21 — End: ?

## 2021-03-31 ENCOUNTER — Encounter: Admit: 2021-03-31 | Discharge: 2021-03-31 | Payer: MEDICARE | Primary: Primary Care

## 2021-04-01 MED FILL — PRALUENT PEN 150 MG/ML SC PNIJ: 150 mg/mL | SUBCUTANEOUS | 28 days supply | Qty: 2 | Fill #6 | Status: AC

## 2021-05-06 ENCOUNTER — Encounter: Admit: 2021-05-06 | Discharge: 2021-05-06 | Payer: MEDICARE | Primary: Primary Care

## 2021-05-06 MED FILL — PRALUENT PEN 150 MG/ML SC PNIJ: 150 mg/mL | SUBCUTANEOUS | 28 days supply | Qty: 2 | Fill #7 | Status: AC

## 2021-05-13 ENCOUNTER — Encounter: Admit: 2021-05-13 | Discharge: 2021-05-13 | Payer: MEDICARE | Primary: Primary Care

## 2021-05-13 NOTE — Telephone Encounter
Patient called nursing line regarding her chronic lower extremity edema. Patient states that she went to her PCP office and they added another 20mg  of lasix to her medical regimen. She states that she lost 9 pound with this medication change, but then she gained it right back. Patient states that she does not take daily blood pressure and heart rates at home. She states that she would like for someone to review her medications to see if there is something else she can try. Informed patient that Ginger , APRN is in our [place] office this Friday. Patient states that she would like to see her for medication follow up. Patient confirmed D-T-L of appointment.

## 2021-05-16 ENCOUNTER — Encounter: Admit: 2021-05-16 | Discharge: 2021-05-16 | Payer: MEDICARE | Primary: Primary Care

## 2021-05-16 DIAGNOSIS — E039 Hypothyroidism, unspecified: Secondary | ICD-10-CM

## 2021-05-16 DIAGNOSIS — E782 Mixed hyperlipidemia: Secondary | ICD-10-CM

## 2021-05-16 DIAGNOSIS — G2581 Restless legs syndrome: Secondary | ICD-10-CM

## 2021-05-16 DIAGNOSIS — R931 Abnormal findings on diagnostic imaging of heart and coronary circulation: Secondary | ICD-10-CM

## 2021-05-16 DIAGNOSIS — I2510 Atherosclerotic heart disease of native coronary artery without angina pectoris: Secondary | ICD-10-CM

## 2021-05-16 DIAGNOSIS — M1990 Unspecified osteoarthritis, unspecified site: Secondary | ICD-10-CM

## 2021-05-16 DIAGNOSIS — G35 Multiple sclerosis: Secondary | ICD-10-CM

## 2021-05-16 DIAGNOSIS — M549 Dorsalgia, unspecified: Secondary | ICD-10-CM

## 2021-05-16 DIAGNOSIS — R600 Localized edema: Secondary | ICD-10-CM

## 2021-05-16 DIAGNOSIS — F32A Depression: Secondary | ICD-10-CM

## 2021-05-16 DIAGNOSIS — R32 Unspecified urinary incontinence: Secondary | ICD-10-CM

## 2021-05-16 DIAGNOSIS — G4700 Insomnia, unspecified: Secondary | ICD-10-CM

## 2021-05-16 DIAGNOSIS — H269 Unspecified cataract: Secondary | ICD-10-CM

## 2021-05-16 DIAGNOSIS — I10 Essential (primary) hypertension: Secondary | ICD-10-CM

## 2021-05-16 DIAGNOSIS — K219 Gastro-esophageal reflux disease without esophagitis: Secondary | ICD-10-CM

## 2021-05-16 MED ORDER — SPIRONOLACTONE 25 MG PO TAB
25 mg | ORAL_TABLET | Freq: Every day | ORAL | 11 refills | 90.00000 days | Status: AC
Start: 2021-05-16 — End: ?

## 2021-05-16 MED ORDER — FUROSEMIDE 80 MG PO TAB
80 mg | ORAL_TABLET | Freq: Every day | ORAL | 3 refills | 90.00000 days | Status: AC
Start: 2021-05-16 — End: ?

## 2021-05-16 MED ORDER — FUROSEMIDE 20 MG PO TAB
20 mg | ORAL_TABLET | Freq: Every day | ORAL | 1 refills | 90.00000 days | Status: AC
Start: 2021-05-16 — End: ?

## 2021-05-16 MED ORDER — FUROSEMIDE 20 MG PO TAB
20 mg | ORAL_TABLET | Freq: Every morning | ORAL | 0 refills | 90.00000 days | Status: AC
Start: 2021-05-16 — End: ?

## 2021-05-24 ENCOUNTER — Encounter: Admit: 2021-05-24 | Discharge: 2021-05-24 | Payer: MEDICARE | Primary: Primary Care

## 2021-05-27 ENCOUNTER — Encounter: Admit: 2021-05-27 | Discharge: 2021-05-27 | Payer: MEDICARE | Primary: Primary Care

## 2021-05-27 ENCOUNTER — Ambulatory Visit: Admit: 2021-05-27 | Discharge: 2021-05-27 | Payer: MEDICARE | Primary: Primary Care

## 2021-05-27 DIAGNOSIS — I2510 Atherosclerotic heart disease of native coronary artery without angina pectoris: Secondary | ICD-10-CM

## 2021-05-28 ENCOUNTER — Encounter: Admit: 2021-05-28 | Discharge: 2021-05-28 | Payer: MEDICARE | Primary: Primary Care

## 2021-05-28 DIAGNOSIS — R600 Localized edema: Secondary | ICD-10-CM

## 2021-05-28 DIAGNOSIS — I2510 Atherosclerotic heart disease of native coronary artery without angina pectoris: Secondary | ICD-10-CM

## 2021-05-28 MED ORDER — FUROSEMIDE 20 MG PO TAB
20 mg | ORAL_TABLET | Freq: Every day | ORAL | 1 refills | 90.00000 days | Status: AC
Start: 2021-05-28 — End: ?

## 2021-05-28 NOTE — Telephone Encounter
Called pt to discuss results and recommendations.    Pt will continue to monitor BP/HR/Weight and bring log into her appointment with Dr. Avie Arenas next week.  She said that she is only down about 1 pound and is still having lots of swelling.  She is taking her medications as prescribed.  She is working on diet and is following the fluid restriction.  She did have labs drawn at her PCPs office on 2/28.  I have requested those lab results for review.  Her bp is running around 133/70, she is unsure what her heart rate is usually running around.    Will route to Ginger McIntosh-James, APRN for any additional recommendations.

## 2021-05-28 NOTE — Telephone Encounter
-----   Message from Ginger E McIntosh-James, APRN-NP sent at 05/27/2021  6:51 PM CST -----  Nurses, please let the patient know that I reviewed the results of her echo, study interpreted by Dr. Christain Sacramento.  This is a technically difficult study, contrast was not utilized.  The LV size appears normal with dynamic systolic function, EF 75%.  Normal RV size and systolic function.  Normal biatrial size.  No significant valvular disease and no pericardial effusion.  There is no change compared to the prior study dated 02/06/2019.    I saw her in the [place] office on 2/24 with complaints of increasing lower extremity edema and weight gain of 15 pounds in a 2-week period on her home scale.    -Symptoms may be a clinical feature of diastolic dysfunction.  - Suspect hyperdynamic systolic function is due to uncontrolled hypertension.  It is imperative that we get her blood pressure controlled.  - She needs strict compliance with a low-sodium diet and 2 L fluid restriction.  -I asked patient to get a BMP and BNP checked, but there is no result in her chart--please check on that.  -If she has not had labs drawn make sure she gets this done this week because at her visit I increased her Lasix and added spironolactone, stopped potassium.  -Please get update on weights and blood pressure from patient.  -Continue with plan  is outlined in my office visit note dated 05/16/2021.  -Follow-up with Dr. Avie Arenas as scheduled on 06/03/2021.  Thanks. GM

## 2021-06-03 ENCOUNTER — Encounter: Admit: 2021-06-03 | Discharge: 2021-06-03 | Payer: MEDICARE | Primary: Primary Care

## 2021-06-03 ENCOUNTER — Ambulatory Visit: Admit: 2021-06-03 | Discharge: 2021-06-04 | Payer: MEDICARE | Primary: Primary Care

## 2021-06-03 DIAGNOSIS — E039 Hypothyroidism, unspecified: Secondary | ICD-10-CM

## 2021-06-03 DIAGNOSIS — I2510 Atherosclerotic heart disease of native coronary artery without angina pectoris: Secondary | ICD-10-CM

## 2021-06-03 DIAGNOSIS — I10 Essential (primary) hypertension: Secondary | ICD-10-CM

## 2021-06-03 DIAGNOSIS — R931 Abnormal findings on diagnostic imaging of heart and coronary circulation: Secondary | ICD-10-CM

## 2021-06-03 DIAGNOSIS — M1990 Unspecified osteoarthritis, unspecified site: Secondary | ICD-10-CM

## 2021-06-03 DIAGNOSIS — G35 Multiple sclerosis: Secondary | ICD-10-CM

## 2021-06-03 DIAGNOSIS — H269 Unspecified cataract: Secondary | ICD-10-CM

## 2021-06-03 DIAGNOSIS — F32A Depression: Secondary | ICD-10-CM

## 2021-06-03 DIAGNOSIS — E782 Mixed hyperlipidemia: Secondary | ICD-10-CM

## 2021-06-03 DIAGNOSIS — G4700 Insomnia, unspecified: Secondary | ICD-10-CM

## 2021-06-03 DIAGNOSIS — R32 Unspecified urinary incontinence: Secondary | ICD-10-CM

## 2021-06-03 DIAGNOSIS — R600 Localized edema: Secondary | ICD-10-CM

## 2021-06-03 DIAGNOSIS — Z720 Tobacco use: Secondary | ICD-10-CM

## 2021-06-03 DIAGNOSIS — M549 Dorsalgia, unspecified: Secondary | ICD-10-CM

## 2021-06-03 DIAGNOSIS — G2581 Restless legs syndrome: Secondary | ICD-10-CM

## 2021-06-03 DIAGNOSIS — L03116 Cellulitis of left lower limb: Secondary | ICD-10-CM

## 2021-06-03 DIAGNOSIS — K219 Gastro-esophageal reflux disease without esophagitis: Secondary | ICD-10-CM

## 2021-06-03 LAB — BASIC METABOLIC PANEL
ANION GAP: 15
BLD UREA NITROGEN: 10
CALCIUM: 9.7
CHLORIDE: 102
CO2: 25
CREATININE: 0.7
GLUCOSE,PANEL: 104
POTASSIUM: 3 — ABNORMAL LOW (ref 3.5–5.1)
SODIUM: 142

## 2021-06-03 MED ORDER — TORSEMIDE 20 MG PO TAB
40 mg | ORAL_TABLET | Freq: Two times a day (BID) | ORAL | 3 refills | 67.50000 days | Status: AC
Start: 2021-06-03 — End: ?

## 2021-06-03 MED ORDER — METOLAZONE 2.5 MG PO TAB
2.5 mg | ORAL_TABLET | Freq: Every day | ORAL | 3 refills | 84.00000 days | Status: AC
Start: 2021-06-03 — End: ?

## 2021-06-03 MED ORDER — POTASSIUM CHLORIDE 10 MEQ PO TBER
40 meq | ORAL_TABLET | Freq: Two times a day (BID) | ORAL | 3 refills | 30.00000 days | Status: AC
Start: 2021-06-03 — End: ?

## 2021-06-03 MED ORDER — SPIRONOLACTONE 25 MG PO TAB
25 mg | ORAL_TABLET | Freq: Every day | ORAL | 3 refills | 90.00000 days | Status: AC
Start: 2021-06-03 — End: ?

## 2021-06-03 NOTE — Patient Instructions
Stop lasix furosemide   Start torsemide 40mg  twice daily  Start metolazone 2.5mg  daily  Decrease aldactone/spirolactone to 25mg  daily     Recheck labs in 1wk    Follow up as directed

## 2021-06-04 ENCOUNTER — Encounter: Admit: 2021-06-04 | Discharge: 2021-06-04 | Payer: MEDICARE | Primary: Primary Care

## 2021-06-17 ENCOUNTER — Encounter: Admit: 2021-06-17 | Discharge: 2021-06-17 | Payer: MEDICARE | Primary: Primary Care

## 2021-06-18 ENCOUNTER — Encounter: Admit: 2021-06-18 | Discharge: 2021-06-18 | Payer: MEDICARE | Primary: Primary Care

## 2021-06-18 DIAGNOSIS — E782 Mixed hyperlipidemia: Secondary | ICD-10-CM

## 2021-06-18 DIAGNOSIS — Z720 Tobacco use: Secondary | ICD-10-CM

## 2021-06-18 DIAGNOSIS — R600 Localized edema: Secondary | ICD-10-CM

## 2021-06-18 DIAGNOSIS — R931 Abnormal findings on diagnostic imaging of heart and coronary circulation: Secondary | ICD-10-CM

## 2021-06-18 DIAGNOSIS — I2510 Atherosclerotic heart disease of native coronary artery without angina pectoris: Secondary | ICD-10-CM

## 2021-06-18 DIAGNOSIS — G35 Multiple sclerosis: Secondary | ICD-10-CM

## 2021-06-18 DIAGNOSIS — I10 Essential (primary) hypertension: Secondary | ICD-10-CM

## 2021-06-20 ENCOUNTER — Encounter: Admit: 2021-06-20 | Discharge: 2021-06-20 | Payer: MEDICARE | Primary: Primary Care

## 2021-06-20 MED FILL — PRALUENT PEN 150 MG/ML SC PNIJ: 150 mg/mL | SUBCUTANEOUS | 28 days supply | Qty: 2 | Fill #8 | Status: AC

## 2021-07-04 ENCOUNTER — Encounter: Admit: 2021-07-04 | Discharge: 2021-07-04 | Payer: MEDICARE | Primary: Primary Care

## 2021-07-04 DIAGNOSIS — M549 Dorsalgia, unspecified: Secondary | ICD-10-CM

## 2021-07-04 DIAGNOSIS — M1990 Unspecified osteoarthritis, unspecified site: Secondary | ICD-10-CM

## 2021-07-04 DIAGNOSIS — I5033 Acute on chronic diastolic (congestive) heart failure: Secondary | ICD-10-CM

## 2021-07-04 DIAGNOSIS — G4700 Insomnia, unspecified: Secondary | ICD-10-CM

## 2021-07-04 DIAGNOSIS — E782 Mixed hyperlipidemia: Secondary | ICD-10-CM

## 2021-07-04 DIAGNOSIS — R931 Abnormal findings on diagnostic imaging of heart and coronary circulation: Secondary | ICD-10-CM

## 2021-07-04 DIAGNOSIS — I2510 Atherosclerotic heart disease of native coronary artery without angina pectoris: Secondary | ICD-10-CM

## 2021-07-04 DIAGNOSIS — K219 Gastro-esophageal reflux disease without esophagitis: Secondary | ICD-10-CM

## 2021-07-04 DIAGNOSIS — R32 Unspecified urinary incontinence: Secondary | ICD-10-CM

## 2021-07-04 DIAGNOSIS — F32A Depression: Secondary | ICD-10-CM

## 2021-07-04 DIAGNOSIS — Z720 Tobacco use: Secondary | ICD-10-CM

## 2021-07-04 DIAGNOSIS — R600 Localized edema: Secondary | ICD-10-CM

## 2021-07-04 DIAGNOSIS — H269 Unspecified cataract: Secondary | ICD-10-CM

## 2021-07-04 DIAGNOSIS — I10 Essential (primary) hypertension: Secondary | ICD-10-CM

## 2021-07-04 DIAGNOSIS — G35 Multiple sclerosis: Secondary | ICD-10-CM

## 2021-07-04 DIAGNOSIS — E039 Hypothyroidism, unspecified: Secondary | ICD-10-CM

## 2021-07-04 DIAGNOSIS — G2581 Restless legs syndrome: Secondary | ICD-10-CM

## 2021-07-04 NOTE — Progress Notes
Date of Service: 07/04/2021    Nicole Hopkins is a 66 y.o. female.       HPI     Ms. Nicole Hopkins was seen in our office today for follow-up of her lower extremity edema/HFpEF.  She is accompanied today by her mother.  She is a 66 year old female followed in office by Dr. Larwance Sachs.  She is significant for chronic peripheral edema, chronic HFpEF, recurrent cellulitis, hypercholesterolemia on Praluent therapy, elevated coronary calcium score.  Her CT calcium score in 2019 was 927 (Left main 0, RCA 395, LAD 263, left circumflex 269, PDA 0, diagonal branch 0, OM branches 0), multiple sclerosis, urinary incontinence 2/2 MS,, history of minor trauma in the lower extremities, statin intolerance, chronic low back pain.       A regadenoson MPI stress test performed on 04/13/2018 showed no ischemia with LVEF 87%.    A CT chest scan performed on 01/07/2021 showed a 4 m nodule in her right lower lobe and diffuse mosaic attenuation.    When we saw her in the office on 05/16/2021 she was having increased lower extremity swelling.  We increased her Lasix from 80 mg daily to 80 mg in the morning and 20 mg in the afternoon daily and added spironolactone 25 mg daily to her regimen.  Follow-up laboratories checked on 2/28 showed sodium 142, potassium 3.7, BUN 9i, creatinine 0.81.  A 2D echo Doppler was ordered.    She was evaluated with echocardiogram on 05/27/2021-Normal LVEF = 75%, right ventricle is normal size and systolic function, no significant valvular abnormalities, diastolic function was not determined on the study.  Compared to the study dated 02/06/2019, no significant change noted.    She was last seen in our office on 06/03/2021 by Dr. Avie Arenas.  Weight was up 2  pounds from her visit on 2/24 with not much improvement in her lower extremity edema on increased dose of Lasix.  She was advised to discontinue Lasix and start torsemide 40 mg twice daily.  She was also started on metolazone 2.5 mg twice daily 30 minutes prior to the dose of torsemide.  Spironolactone dose was decreased to 25 mg daily.  She was started on potassium replacement 20 mEq twice daily.  Also advised to start  aspirin 81 mg daily.  She was asked to have basic metabolic panel checked in 1 week and to follow-up in 7 to 10 days in our [place] office.    Laboratories checked same day as office visit 3/14 demonstrated a potassium of 3.0, normal BUN and creatinine.  In light of this Dr. Radford Pax recommended the patient increase her potassium replacement to 60 mEq twice daily for 2 days followed by 40 mEq twice daily thereafter and recheck a Chem-7 in 1 week.    Follow-up labs checked on 3/28 showed improvement in her potassium to 4.5, sodium 140, BUN 26, creatinine 1.0.    She recently has been struggling with some cellulitis.  This was followed by her family primary care provider, Terri Piedra, NP.  The patient was seen in the ED at [facility] in the sun on 4/4.  She was sent by her PCP for treatment of cellulitis of her legs.  I do not have the ER records available to review at her visit today.  She tells me that she was given a single dose of IV antibiotic.  Laboratories checked in the ER showed sodium 138, potassium 3.8, BUN 38, creatinine 1.0, liver function test within normal range,  CRP elevated at 7.4.  Liver function test within normal range.  Hemoglobin 14.4, hematocrit 44, WBCs 11.4, RBCs 4.8, platelets normal.    Today she reports that her leg swelling is better and her weight is trending down.  She weighs at home most days a week and tells me her home scale weights are around 200 pounds.  This correlates with a weight in our office today of 200 pounds which is down 11 pounds from her last visit a month ago.  Current diuretic is torsemide 40 mg twice daily and metolazone 2.5 mg daily prior to the morning dose of torsemide.  She tells me she is not taking spironolactone.  Dr. Avie Arenas had asked her to reduce the dose from 50 mg to 25 mg daily however patient states that she must have misunderstood and thought she was supposed to stop the spironolactone altogether therefore she is not currently taking it.   She tells me her breathing is much better and she is really not having much shortness of air with exertion.  She is not short of breath at rest.  She continues to have lower extremity edema but tells me some days are better than others and usually if she is at home and able to elevate her legs the swelling goes down quite a bit.  Today she is having more swelling because she was at physical therapy and then came here and has been sitting more this afternoon.  She denies PND, orthopnea and abdominal bloating or fullness.  She is not having chest pain, exertional chest pain, palpitations, dizziness, lightheadedness near-syncope or syncope.  Denies fever, chills, night sweats and cough. She is not reporting TIA or CVA type symptoms.   She tells me she tries hard to be compliant with a low sodium die that it is difficult considering hidden salt in foods.  She does not add extra salt nor does she use salt when cooking.  She is pending in physical therapy for her back pain and for strengthening due to her MS.  She has a history of prior back surgery 2 or 3 years ago.  She also has problems with urinary incontinence related to her MS so it is challenging to control her edema with diuretics.  Blood pressure today is 126/66, pulse 74 bpm.    Assessment and Plan:   1.  Chronic HFpEF.   2D echo Doppler study dated 05/27/2021 demonstrated normal LVEF, however diastolic function was not assessed on this study.  She is normotensive.  Laboratories acceptable as of 4//23 with sodium 138, potassium 3.8, creatinine 1.0.  She continues to have swelling in her legs which is improved and her weight is down 11 pounds in the past 4 weeks on torsemide 40 mg twice daily and metolazone 2.5 mg daily prior to her morning dose of torsemide.  She is currently not taking spironolactone as she misunderstood instructions at her last visit.  I asked her to restart the spironolactone 25 mg daily and we will check follow-up laboratories in 1 week.    2.  Bilateral lower extremity edema, 2+ on exam today.  This is probably due to HFpEF.  Patient reports compliance with a low-sodium diet.    3.  Bilateral lower extremity cellulitis-currently treated with doxycycline.  She did receive a single dose of IV antibiotic last week in the ED (patient cannot recall name of drug).  On examination today her legs are swollen and hyperemic but she does not have evidence of cellulitis.  4.. Coronary artery disease, manifested by a positive coronary calcium score of 927 in 2019.  Patient was evaluated with a perfusion imaging study in January 2020-no ischemia, LVEF 87%.  She is asymptomatic, denies chest pain and angina symptoms.    5   Hypertension.  Controlled on current therapy.  Currently on ACE inhibitor.  We are restarting spironolactone today.     6.  Multiple sclerosis-patient is currently on fingolimod (Gilenya), she does have right-sided weakness and deficit that is probably a combined etiology of low back problems as well as MS     7.  Dyspnea on exertion.  Improved.     8. Dyslipidemia.  Treated and controlled on Praluent 150 mg every 2 weeks. Her last lipid profile checked on  10/29/20 showed a  total cholesterol 141, triglycerides 125, HDL 46, LDL 70.  LDL at goal.     9.  Pulmonary nodule.  CT chest scan from 01/07/2021 showed a 4 mm nodule in the right lower lobe.  Followed by PCP.    10.  Obesity, BMI 36.6.    11.  Tobacco use-patient continues to smoke a pack of cigarettes a day.    12.  Chronic low back pain-status post previous lumbar fusion.    -Resume spironolactone 25 mg daily.   -Continue torsemide 40 mg twice daily  - Continue current dose potassium 20 mEq twice daily  -Continue metolazone 2.5 mg daily, take 30 minutes prior to torsemide.  -BMP in 1 week  -Consider adding SGLT2 better, Jardiance 10 mg daily at next visit.  - If SGLT2 inhibitor is added consider stopping the metolazone or possibly reduce the dose of her torsemide.  - Check weight, blood pressure and pulse daily and keep diary to bring to each visit.  - Instructed to call office for weight gain of 3 pounds overnight or 5 pounds in 1 week.  -Instructed to call office if her lower extremity swelling worsens.  -Instructed to call office if her blood pressures consistently running above 135 systolic or above 85 diastolic.  - Strict 2 g sodium restricted diet.  -2 L fluid restriction.  -.Encourage continue use of compression stockings.  -Recommend weight reduction.  -Close follow-up in our office to reassess edema.      Follow-up: Dr. Jeannette Corpus in 6-8 weeks and Dr. Geronimo Boot in 4 months.    Patient encouraged to contact our office if she has problems prior to next visit.     I have educated the patient on plan of care.  Patient verbally expressed understanding and agreement with the plan.  Specific instructions are outlined in the after visit summary document.     Thank you for the opportunity to participate in the care of your patient.  If you have any questions or concerns please do not hesitate to contact me.     I spent 50 minutes on this encounter including time for chart review including reviewing outside records, physical exam,  assessment, formation of treatment plan,  follow up and documentation.  I reviewed his/her most recent laboratory, radiologic and cardiovascular test results  at the time of patient's visit.  I reviewed and discussed his/her heart disease, medication instructions, medication interactions,  exercise, diet, lipid treatment, blood pressure monitoring, blood pressure goals, diet/ low sodium diet, weight reduction,exercise, s/s fluid build up/volume overload, follow up plan.  We reviewed the above treatment plan, went over risks/benefits/alternatives to the therapies, and all questions were answered to his/her satisfaction. Educated patient on importance  of compliance with recommended medical therapies and keeping follow-up appointments.                                                DRB  Vitals:    07/04/21 1522   BP: 126/66   BP Source: Arm, Left Upper   Pulse: 74   SpO2: 92%   O2 Percent: 92 %   O2 Device: None (Room air)   PainSc: Ten   Weight: 90.8 kg (200 lb 3.2 oz)   Height: 157.5 cm (5' 2)     Body mass index is 36.62 kg/m?Marland Kitchen     Past Medical History  Patient Active Problem List    Diagnosis Date Noted   ? Tobacco use 06/03/2021   ? Acute on chronic heart failure with preserved ejection fraction (HCC) 06/03/2021   ? Bilateral lower leg cellulitis 06/03/2021   ? Class 2 severe obesity due to excess calories with serious comorbidity and body mass index (BMI) of 39.0 to 39.9 in adult [facility]) 06/03/2021   ? Bilateral lower extremity edema 05/16/2021   ? Primary hypertension 05/16/2021   ? Mixed hyperlipidemia 07/01/2018   ? Coronary artery disease due to calcified coronary lesion 07/01/2018   ? Arthritis 04/04/2018   ? Back pain 04/04/2018   ? Cataracts, bilateral 04/04/2018   ? Depression 04/04/2018   ? GERD (gastroesophageal reflux disease) 04/04/2018   ? Insomnia 04/04/2018   ? Multiple sclerosis (HCC) 04/04/2018   ? Restless leg syndrome 04/04/2018   ? Urinary incontinence 04/04/2018   ? Elevated coronary artery calcium score 04/01/2018     03/14/2018  Coronary Artery Calcium Score: 927.  Left main 0. RCA 395. LAD 263. Circumflex 269.  PDA 0. Diagonal branches 0. Marginal Branches 0           Review of Systems   Constitutional: Positive for malaise/fatigue.   HENT: Negative.    Eyes: Negative.    Cardiovascular: Positive for leg swelling.   Respiratory: Negative.    Endocrine: Negative.    Hematologic/Lymphatic: Negative.    Skin: Positive for suspicious lesions.   Musculoskeletal: Positive for arthritis, back pain, falls, muscle weakness and stiffness.   Gastrointestinal: Negative.    Genitourinary: Positive for bladder incontinence.   Neurological: Positive for loss of balance and weakness.   Psychiatric/Behavioral: Negative.    Allergic/Immunologic: Positive for environmental allergies.       Physical Exam  Vital signs were reviewed.   General Appearance: appears well nourished, appears relaxed, in no acute distress, obese with BMI 36.6  Skin: warm, moist, intact, no rash or lesions, no xanthomas  HEENT: unremarkable, pupils equal and round, no scleral icterus, conjunctivae and lids normal  Lips & Mouth: no pallor or cyanosis  Neck Veins: CVP, CVP is not elevated above the sternal notch,neck veins are flat, neck veins are not distended , no HJR  Carotid Arteries: normal carotid upstroke bilaterally, no bruits bilaterally  Chest Inspection: chest is normal in appearance  Auscultation/Percussion/Effort: lungs clear to auscultation, no rales, rhonchi, or wheezing, respirations even and unlabored, no respiratory distress  Cardiac Rhythm: regular rhythm and normal rate   Cardiac Auscultation: normal S1 & S2, no S3 or S4, no rub   Murmurs: no cardiac murmurs   Extremities: 2+ lower extremity edema bilaterally, 2+ symmetric distal pulses,b lateral circumferential hyperemia lower one third of  forelegs without blistering, weeping or open sores no evidence of cellulitis  Abdominal Exam: soft, non-tender,non-distended, no obvious masses, bowel sounds normal, no guarding, no abdominal bruits  Liver & Spleen: no organomegaly   Neurologic Exam: grossly intact, alert, moves all extremities equally   Orientation: oriented to time, person and place, clear historian  Gait: normal, steady, walks without assistance  Language & Memory: speech clear, patient responsive, seems to comprehend information  Psych: appropriate mood and affect, thought content and behavior normal                       Cardiovascular Health Factors  Vitals BP Readings from Last 3 Encounters:   07/04/21 126/66   06/03/21 118/68   05/27/21 128/75     Wt Readings from Last 3 Encounters:   07/04/21 90.8 kg (200 lb 3.2 oz)   06/03/21 95.7 kg (211 lb)   05/27/21 95 kg (209 lb 7 oz)     BMI Readings from Last 3 Encounters:   07/04/21 36.62 kg/m?   06/03/21 38.59 kg/m?   05/27/21 38.31 kg/m?      Smoking Social History     Tobacco Use   Smoking Status Every Day   ? Packs/day: 1.00   ? Types: Cigarettes   Smokeless Tobacco Never      Lipid Profile Cholesterol   Date Value Ref Range Status   10/29/2020 141  Final     HDL   Date Value Ref Range Status   10/29/2020 46  Final     LDL   Date Value Ref Range Status   10/29/2020 70  Final     Triglycerides   Date Value Ref Range Status   10/29/2020 125  Final      Blood Sugar No results found for: HGBA1C  Glucose   Date Value Ref Range Status   06/24/2021 121 (H) 70 - 105 Final   06/17/2021 92 65 - 99 mg/dL Final     Comment:                   Fasting reference interval        06/17/2021 92  Final   06/03/2021 104  Final          Problems Addressed Today  Encounter Diagnoses   Name Primary?   ? Acute on chronic heart failure with preserved ejection fraction (HCC) Yes   ? Coronary artery disease due to calcified coronary lesion    ? Elevated coronary artery calcium score    ? Mixed hyperlipidemia    ? Primary hypertension    ? Multiple sclerosis (HCC)    ? Bilateral lower extremity edema    ? Tobacco use                      Current Medications (including today's revisions)  ? acetaminophen (TYLENOL) 500 mg tablet Take two tablets by mouth as Needed for Pain. Max of 4,000 mg of acetaminophen in 24 hours.   ? alirocumab (PRALUENT PEN) 150 mg/mL injectable PEN Inject 1 mL under the skin every 14 days.   ? baclofen (LIORESAL) 10 mg tablet Take one tablet by mouth at bedtime daily.   ? cholecalciferol (vitamin D3) (VITAMIN D3) 50 mcg (2,000 unit) tablet Take one tablet by mouth daily.   ? diphenhydrAMINE (BENADRYL) 25 mg capsule Take one capsule by mouth as Needed.   ? doxycycline hyclate (VIBRACIN) 100 mg tablet Take one tablet  by mouth twice daily.   ? fingolimod (GILENYA) 0.5 mg Take one capsule by mouth daily.   ? fluticasone propionate (FLONASE) 50 mcg/actuation nasal spray, suspension Apply two sprays to each nostril as directed as Needed. Shake bottle gently before using.   ? levothyroxine (SYNTHROID) 137 mcg tablet Take one tablet by mouth daily 30 minutes before breakfast.   ? lisinopril (PRINIVIL; ZESTRIL) 20 mg tablet Take one tablet by mouth daily.   ? metOLazone (ZAROXOLYN) 2.5 mg tablet Take one tablet by mouth daily.   ? MULTIVITAMIN PO Take 1 tablet by mouth daily.   ? pantoprazole DR (PROTONIX) 40 mg tablet Take one tablet by mouth daily.   ? potassium chloride (K-TAB) 10 mEq tablet Take four tablets by mouth twice daily. Take with a meal and a full glass of water. Take addition potassium as directed by cardiology (Patient taking differently: Take two tablets by mouth twice daily. Take with a meal and a full glass of water. Take addition potassium as directed by cardiology)   ? pramipexole (MIRAPEX) 0.5 mg tablet Take one tablet by mouth three times daily.   ? spironolactone (ALDACTONE) 25 mg tablet Take one tablet by mouth daily. Take with food.   ? torsemide (DEMADEX) 20 mg tablet Take two tablets by mouth twice daily.

## 2021-07-24 ENCOUNTER — Encounter: Admit: 2021-07-24 | Discharge: 2021-07-24 | Payer: MEDICARE | Primary: Primary Care

## 2021-07-25 ENCOUNTER — Encounter: Admit: 2021-07-25 | Discharge: 2021-07-25 | Payer: MEDICARE | Primary: Primary Care

## 2021-07-25 MED ORDER — TORSEMIDE 20 MG PO TAB
ORAL_TABLET | ORAL | 3 refills | 67.50000 days | Status: AC
Start: 2021-07-25 — End: ?

## 2021-07-26 ENCOUNTER — Encounter: Admit: 2021-07-26 | Discharge: 2021-07-26 | Payer: MEDICARE | Primary: Primary Care

## 2021-07-28 ENCOUNTER — Encounter: Admit: 2021-07-28 | Discharge: 2021-07-28 | Payer: MEDICARE | Primary: Primary Care

## 2021-07-29 ENCOUNTER — Encounter: Admit: 2021-07-29 | Discharge: 2021-07-29 | Payer: MEDICARE | Primary: Primary Care

## 2021-07-29 DIAGNOSIS — I5033 Acute on chronic diastolic (congestive) heart failure: Secondary | ICD-10-CM

## 2021-07-29 DIAGNOSIS — I10 Essential (primary) hypertension: Secondary | ICD-10-CM

## 2021-07-29 DIAGNOSIS — I2510 Atherosclerotic heart disease of native coronary artery without angina pectoris: Secondary | ICD-10-CM

## 2021-07-29 MED ORDER — POTASSIUM CHLORIDE 10 MEQ PO TBER
20 meq | ORAL_TABLET | Freq: Two times a day (BID) | ORAL | 3 refills | 30.00000 days | Status: AC
Start: 2021-07-29 — End: ?

## 2021-07-29 NOTE — Telephone Encounter
Called and discussed lab results and recommendations with patient.  She states the swelling is "so-so".  Maybe a little better, maybe about the same.  She will continue her spironolactone.  She will reduce the KCL in half to 20 mEq twice daily.  She will recheck labs prior to appt on 5/19. Faxed lab order to [facility] per patient request.

## 2021-07-30 ENCOUNTER — Encounter: Admit: 2021-07-30 | Discharge: 2021-07-30 | Payer: MEDICARE | Primary: Primary Care

## 2021-07-31 ENCOUNTER — Encounter: Admit: 2021-07-31 | Discharge: 2021-07-31 | Payer: MEDICARE | Primary: Primary Care

## 2021-08-01 ENCOUNTER — Encounter: Admit: 2021-08-01 | Discharge: 2021-08-01 | Payer: MEDICARE | Primary: Primary Care

## 2021-08-05 ENCOUNTER — Encounter: Admit: 2021-08-05 | Discharge: 2021-08-05 | Payer: MEDICARE | Primary: Primary Care

## 2021-08-06 ENCOUNTER — Encounter: Admit: 2021-08-06 | Discharge: 2021-08-06 | Payer: MEDICARE | Primary: Primary Care

## 2021-08-06 MED FILL — PRALUENT PEN 150 MG/ML SC PNIJ: 150 mg/mL | SUBCUTANEOUS | 28 days supply | Qty: 2 | Fill #9 | Status: AC

## 2021-08-08 ENCOUNTER — Encounter: Admit: 2021-08-08 | Discharge: 2021-08-08 | Payer: MEDICARE | Primary: Primary Care

## 2021-09-08 ENCOUNTER — Encounter: Admit: 2021-09-08 | Discharge: 2021-09-08 | Payer: MEDICARE | Primary: Primary Care

## 2021-09-08 MED ORDER — PRALUENT PEN 150 MG/ML SC PNIJ
150 mg | SUBCUTANEOUS | 11 refills
Start: 2021-09-08 — End: ?

## 2021-09-09 ENCOUNTER — Encounter: Admit: 2021-09-09 | Discharge: 2021-09-09 | Payer: MEDICARE | Primary: Primary Care

## 2021-09-09 NOTE — Progress Notes
No prior authorization was required for praluent for Nicole Hopkins.  The copay is $47.    Copay assistance of $47 was obtained for the patient using grant from [facility] and now the copay is $0.  Nicole Hopkins has stated this copay is affordable.  The specialty pharmacy will pursue additional copay assistance as necessary.  The specialty pharmacy will reach out to the provider's nurse if the copay becomes unaffordable.    The medication will be delivered to patient's prescription address per the patient's request..    Lucie Leather  Pharmacy Patient Advocate  [phone]

## 2021-09-10 MED FILL — PRALUENT PEN 150 MG/ML SC PNIJ: 150 mg/mL | SUBCUTANEOUS | 28 days supply | Qty: 2 | Fill #1 | Status: AC

## 2021-10-08 ENCOUNTER — Encounter: Admit: 2021-10-08 | Discharge: 2021-10-08 | Payer: MEDICARE | Primary: Primary Care

## 2021-10-09 MED FILL — PRALUENT PEN 150 MG/ML SC PNIJ: 150 mg/mL | SUBCUTANEOUS | 28 days supply | Qty: 2 | Fill #2 | Status: AC

## 2021-11-21 ENCOUNTER — Encounter: Admit: 2021-11-21 | Discharge: 2021-11-21 | Payer: MEDICARE | Primary: Primary Care

## 2021-11-24 ENCOUNTER — Encounter: Admit: 2021-11-24 | Discharge: 2021-11-24 | Payer: MEDICARE | Primary: Primary Care

## 2021-11-24 MED FILL — PRALUENT PEN 150 MG/ML SC PNIJ: 150 mg/mL | SUBCUTANEOUS | 28 days supply | Qty: 2 | Fill #3 | Status: AC

## 2022-01-01 ENCOUNTER — Encounter: Admit: 2022-01-01 | Discharge: 2022-01-01 | Payer: MEDICARE | Primary: Primary Care

## 2022-01-04 MED FILL — PRALUENT PEN 150 MG/ML SC PNIJ: 150 mg/mL | SUBCUTANEOUS | 28 days supply | Qty: 2 | Fill #4 | Status: AC

## 2022-02-03 ENCOUNTER — Encounter: Admit: 2022-02-03 | Discharge: 2022-02-03 | Payer: MEDICARE | Primary: Primary Care

## 2022-02-04 MED FILL — PRALUENT PEN 150 MG/ML SC PNIJ: 150 mg/mL | SUBCUTANEOUS | 28 days supply | Qty: 2 | Fill #5 | Status: AC

## 2022-03-19 ENCOUNTER — Encounter: Admit: 2022-03-19 | Discharge: 2022-03-19 | Payer: MEDICARE | Primary: Primary Care

## 2022-03-23 MED FILL — PRALUENT PEN 150 MG/ML SC PNIJ: 150 mg/mL | SUBCUTANEOUS | 28 days supply | Qty: 2 | Fill #6 | Status: AC

## 2022-03-27 ENCOUNTER — Encounter: Admit: 2022-03-27 | Discharge: 2022-03-27 | Payer: MEDICARE | Primary: Primary Care

## 2022-03-27 NOTE — Telephone Encounter
Patient called nursing line stating that she has been experiencing quite a bit of swelling in her legs. She denies any other associated symptoms of chest pain, shortness of breath, palpitations, etc. Patient states that she does not do daily weights, HR , or blood pressures. She states that she has not been taking her diuretic medications due to "making her kidneys hurt". Urged patient to take medications as prescribed.  Educated patient about importance of obtaining daily weights, BP, and HR and to bring log with her to her appointment on 03/31/21 with [facility]. Urged patient to monitor symptoms closely and to report to the ER for any concerning or worsening symptoms. Patient verbalized understanding and has no further questions at this time.

## 2022-03-31 ENCOUNTER — Encounter: Admit: 2022-03-31 | Discharge: 2022-03-31 | Payer: MEDICARE | Primary: Primary Care

## 2022-03-31 DIAGNOSIS — E782 Mixed hyperlipidemia: Secondary | ICD-10-CM

## 2022-03-31 DIAGNOSIS — M1990 Unspecified osteoarthritis, unspecified site: Secondary | ICD-10-CM

## 2022-03-31 DIAGNOSIS — I2510 Atherosclerotic heart disease of native coronary artery without angina pectoris: Secondary | ICD-10-CM

## 2022-03-31 DIAGNOSIS — R32 Unspecified urinary incontinence: Secondary | ICD-10-CM

## 2022-03-31 DIAGNOSIS — R931 Abnormal findings on diagnostic imaging of heart and coronary circulation: Secondary | ICD-10-CM

## 2022-03-31 DIAGNOSIS — K219 Gastro-esophageal reflux disease without esophagitis: Secondary | ICD-10-CM

## 2022-03-31 DIAGNOSIS — I10 Essential (primary) hypertension: Secondary | ICD-10-CM

## 2022-03-31 DIAGNOSIS — E039 Hypothyroidism, unspecified: Secondary | ICD-10-CM

## 2022-03-31 DIAGNOSIS — M549 Dorsalgia, unspecified: Secondary | ICD-10-CM

## 2022-03-31 DIAGNOSIS — R600 Localized edema: Secondary | ICD-10-CM

## 2022-03-31 DIAGNOSIS — F32A Depression: Secondary | ICD-10-CM

## 2022-03-31 DIAGNOSIS — I5033 Acute on chronic diastolic (congestive) heart failure: Secondary | ICD-10-CM

## 2022-03-31 DIAGNOSIS — G4700 Insomnia, unspecified: Secondary | ICD-10-CM

## 2022-03-31 DIAGNOSIS — G35 Multiple sclerosis: Secondary | ICD-10-CM

## 2022-03-31 DIAGNOSIS — H269 Unspecified cataract: Secondary | ICD-10-CM

## 2022-03-31 DIAGNOSIS — G2581 Restless legs syndrome: Secondary | ICD-10-CM

## 2022-03-31 MED ORDER — BUMETANIDE 2 MG PO TAB
2 mg | ORAL_TABLET | Freq: Every day | ORAL | 1 refills | Status: AC
Start: 2022-03-31 — End: ?

## 2022-03-31 MED ORDER — METOLAZONE 2.5 MG PO TAB
2.5 mg | ORAL_TABLET | Freq: Every day | ORAL | 0 refills | 84.00000 days | Status: AC | PRN
Start: 2022-03-31 — End: ?

## 2022-03-31 NOTE — Progress Notes
Obtained patient's verbal consent to treat them and their agreement to [facility] financial policy and NPP via this telehealth visit.     Subjective:       Nicole Hopkins is a 67 y.o. female who comes in for comprehensive follow up.    She has a past medical history of HFpEF, acute on chronic bilateral lower extremity edema with cellulitis (recurrent), hyperlipidemia currently on alirocumab, coronary artery disease manifested by elevated coronary calcium score (December 2019: Left main 0, RCA 395, LAD 263, left circumflex 269, PDA 0, diagonal branch 0, OM branches 0), history of low back surgery (lumbar fusion), chronic low back pain, multiple sclerosis, tobacco use (approximately a pack of cigarettes a day), and right-sided numbness and motor deficit.     She was last seen by Dr. Radford Pax in March 2023 for decompensated heart failure with preserved ejection fraction.  She was prescribed torsemide and metolazone and potassium supplementation due to hypokalemia.  She states that over the past 6 to 8 weeks, she has noticed worsening weight gain and lower extremity edema.  She states that she is about 30 pounds above her dry weight (reports dry weight of 190 pounds and currently she weighs about 220 pounds).  She also has worsening lower extremity edema which she partially attributes to heart failure.  She is also being treated for bilateral lower extremity cellulitis     Patient does not report any chest pain, no heart palpitations, no presyncope or syncope.  She reports discontinuing her torsemide few weeks back because of kidney pain.  She states that every time she took torsemide she had so much pain that she doubled over and hence she discontinued it.      Patient was evaluated with an echocardiogram on 05/27/2021-normal LVEF = 75%, right ventricle is normal size and systolic function, no significant valvular abnormalities, diastolic function was not determined on the study.     Review of Systems  14 point review of systems negative except as above.    Objective:          acetaminophen (TYLENOL) 500 mg tablet Take two tablets by mouth as Needed for Pain. Max of 4,000 mg of acetaminophen in 24 hours.    albuterol sulfate (PROAIR HFA) 90 mcg/actuation HFA aerosol inhaler Inhale two puffs by mouth into the lungs every 6 hours as needed.    alirocumab (PRALUENT PEN) 150 mg/mL injectable PEN Inject 1 mL under the skin every 14 days.    cholecalciferol (vitamin D3) (VITAMIN D3) 50 mcg (2,000 unit) tablet Take one tablet by mouth daily.    diphenhydrAMINE (BENADRYL) 25 mg capsule Take one capsule by mouth as Needed.    fingolimod (GILENYA) 0.5 mg Take one capsule by mouth daily.    fluticasone propionate (FLONASE) 50 mcg/actuation nasal spray, suspension Apply two sprays to each nostril as directed as Needed. Shake bottle gently before using.    levothyroxine (SYNTHROID) 137 mcg tablet Take one tablet by mouth daily 30 minutes before breakfast.    lisinopril (PRINIVIL; ZESTRIL) 20 mg tablet Take one tablet by mouth daily.    MULTIVITAMIN PO Take 1 tablet by mouth daily.    pantoprazole DR (PROTONIX) 40 mg tablet Take one tablet by mouth daily.    potassium chloride (K-TAB) 10 mEq tablet Take two tablets by mouth twice daily. Take with a meal and a full glass of water. Take addition potassium as directed by cardiology    pramipexole (MIRAPEX) 0.5 mg tablet Take one tablet by  mouth three times daily.    torsemide (DEMADEX) 20 mg tablet TAKE 2 TABLETS BY MOUTH TWICE A DAY (Patient taking differently: Patient has not been taking for a week)     Vitals:    03/31/22 1003   BP: Comment: Patient does not have current reading available   Pulse: Comment: Patient does not have current reading available   O2 Device: None (Room air)   PainSc: Eight   Weight: 99.8 kg (220 lb)  Comment: Patient stated current weight today   Height: 157.5 cm (5' 2)     Body mass index is 40.24 kg/m?Marland Kitchen     Physical Exam  Deferred     Assessment:  No diagnosis found.        Summary/Recommendations/Orders  I had the pleasure of seeing Nicole Hopkins in clinic today.          Acute on chronic at least 3+ bilateral lower extremity edema    Bilateral lower extremity cellulitis    Elevated coronary calcium score -- December 2019: Left main 0, RCA 395, LAD 263, left circumflex 269, PDA 0, diagonal branch 0, OM branches 0 (perfusion imaging study in January 2020-no ischemia, LVEF = 87%)    Obesity-patient's BMI is 38.59 kg/m?    Tobacco use-patient does smoke a pack of cigarettes a day    Chronic low back pain-status post previous lumbar fusion   Multiple sclerosis-patient is currently on fingolimod (Gilenya), she does have right-sided weakness and deficit that is probably a combined etiology of low back problems as well as MS  Essential hypertension     Plan:    Patient states that she is unable to take the torsemide due to kidney pain pain.  She is willing to try bumetanide.  Will prescribe Bumex 2 mg twice daily.  Will prescribe metolazone 2.5 mg to be taken in addition to the Bumex if no appropriate diuretic response.  Instructed her to monitor her weight and in the next 48 hours if she is not at least 2 to 3 pounds negative, she will take 1 dose of metolazone 30 minutes prior to her dose of Bumex.  Will recheck her BMP within the next 3 to 4 days given her potassium was low on the BMP done in November 2023.  Continue Praluent, aspirin and lisinopril.  Continue potassium supplementation.  Follow-up in 7 to 10 days in [place] office         All medications were reviewed during this encounter.  The problem list was reviewed, discussed, and documented as above.  We will continue management of other chronic medical conditions unless otherwise stated in the summary and recommendations.           This note was created with dictation software.  Please excuse any grammatical errors missed with proofreading.  ;

## 2022-04-13 ENCOUNTER — Encounter: Admit: 2022-04-13 | Discharge: 2022-04-13 | Payer: MEDICARE | Primary: Primary Care

## 2022-04-13 NOTE — Progress Notes
Records Request - STAT  Medical records request for continuation of care:    Patient has appointment with  Dr. Lurline Del    Please fax records to [facility]    Request records:    Nicole Hopkins  1955/06/12    PLEASE CLOUD THE FOLLOWING IMAGES:  ECHO 04/09/22  CT ANGIO CHEST 04/08/22  US DUPLEX LEG BILATERAL 04/08/22  CXR 04/08/22    Thank you,      Cardiovascular Medicine  [facility]  [address] [place] [address]  Phone:  [phone]  Fax:  [phone]

## 2022-04-21 ENCOUNTER — Encounter: Admit: 2022-04-21 | Discharge: 2022-04-21 | Payer: MEDICARE | Primary: Primary Care

## 2022-04-21 DIAGNOSIS — E039 Hypothyroidism, unspecified: Secondary | ICD-10-CM

## 2022-04-21 DIAGNOSIS — G35 Multiple sclerosis: Secondary | ICD-10-CM

## 2022-04-21 DIAGNOSIS — K219 Gastro-esophageal reflux disease without esophagitis: Secondary | ICD-10-CM

## 2022-04-21 DIAGNOSIS — I5033 Acute on chronic diastolic (congestive) heart failure: Secondary | ICD-10-CM

## 2022-04-21 DIAGNOSIS — I10 Essential (primary) hypertension: Secondary | ICD-10-CM

## 2022-04-21 DIAGNOSIS — G4700 Insomnia, unspecified: Secondary | ICD-10-CM

## 2022-04-21 DIAGNOSIS — G2581 Restless legs syndrome: Secondary | ICD-10-CM

## 2022-04-21 DIAGNOSIS — E782 Mixed hyperlipidemia: Secondary | ICD-10-CM

## 2022-04-21 DIAGNOSIS — F32A Depression: Secondary | ICD-10-CM

## 2022-04-21 DIAGNOSIS — B999 Unspecified infectious disease: Secondary | ICD-10-CM

## 2022-04-21 DIAGNOSIS — R931 Abnormal findings on diagnostic imaging of heart and coronary circulation: Secondary | ICD-10-CM

## 2022-04-21 DIAGNOSIS — H269 Unspecified cataract: Secondary | ICD-10-CM

## 2022-04-21 DIAGNOSIS — M549 Dorsalgia, unspecified: Secondary | ICD-10-CM

## 2022-04-21 DIAGNOSIS — R600 Localized edema: Secondary | ICD-10-CM

## 2022-04-21 DIAGNOSIS — M1990 Unspecified osteoarthritis, unspecified site: Secondary | ICD-10-CM

## 2022-04-21 DIAGNOSIS — R32 Unspecified urinary incontinence: Secondary | ICD-10-CM

## 2022-04-21 DIAGNOSIS — I2510 Atherosclerotic heart disease of native coronary artery without angina pectoris: Secondary | ICD-10-CM

## 2022-04-21 MED ORDER — JARDIANCE 10 MG PO TAB
10 mg | ORAL_TABLET | Freq: Every day | ORAL | 3 refills | Status: AC
Start: 2022-04-21 — End: ?

## 2022-04-21 MED ORDER — BUMETANIDE 1 MG PO TAB
1 mg | ORAL_TABLET | Freq: Two times a day (BID) | ORAL | 3 refills | Status: AC
Start: 2022-04-21 — End: ?

## 2022-04-21 NOTE — Progress Notes
Cardiovascular Medicine       Date of Service: 04/21/2022      HPI     Nicole Hopkins is a 67 y.o. female who was seen today in the Cardiovascular Medicine Clinic at [facility] at our [facility] office.     She has a past medical history of HFpEF, acute on chronic bilateral lower extremity edema with cellulitis (recurrent), hyperlipidemia currently on alirocumab, coronary artery disease manifested by elevated coronary calcium score (December 2019: Left main 0, RCA 395, LAD 263, left circumflex 269, PDA 0, diagonal branch 0, OM branches 0), history of low back surgery (lumbar fusion), chronic low back pain, multiple sclerosis, tobacco use (approximately a pack of cigarettes a day), and right-sided numbness and motor deficit.     She presents today for follow-up.  She was recently hospitalized for decompensated heart failure and fluid overload.  She was diuresed with IV Lasix and states that her breathing is significantly improved.  She was discharged from the hospital at [facility] on 04/11/2022.  She currently takes Bumex 2 mg daily in the morning and metolazone daily.  She reports that she has to use the bathroom every 15 minutes after taking her Bumex every morning.  She was previously on diuretics but had discontinued them for a while because she attributed some flank pain to the diuretics.  She reports compliance with a low-salt, fluid restricted diet.  She does not report any chest pain, no heart palpitations, no presyncope or syncope.      Patient was evaluated with an echocardiogram on 05/27/2021-normal LVEF = 75%, right ventricle is normal size and systolic function, no significant valvular abnormalities, diastolic function was not determined on the study.                   Assessment & Plan   67 y.o. female patient with the following medical problems:     Acute on chronic at least 3+ bilateral lower extremity edema    Bilateral lower extremity cellulitis Elevated coronary calcium score -- December 2019: Left main 0, RCA 395, LAD 263, left circumflex 269, PDA 0, diagonal branch 0, OM branches 0 (perfusion imaging study in January 2020-no ischemia, LVEF = 87%)    Obesity-patient's BMI is 38.59 kg/m?    Tobacco use-patient does smoke a pack of cigarettes a day    Chronic low back pain-status post previous lumbar fusion   Multiple sclerosis-patient is currently on fingolimod (Gilenya), she does have right-sided weakness and deficit that is probably a combined etiology of low back problems as well as MS  Essential hypertension     Plan:     Will change Bumex from 2 mg in the morning to 1 mg in the morning and 1 mg as needed in the afternoon for shortness of breath or lower extremity edema.  He is currently taking metolazone daily.  Instructed her to take metolazone only if she gains more than 2 pounds in a 48-hour.  Will add empagliflozin to her heart failure regimen.  Continue Praluent, aspirin and lisinopril.  Continue potassium supplementation.    She wishes to follow-up with the [facility] office, will arrange for follow-up in 3 to 6 months.         Past Medical History  Patient Active Problem List    Diagnosis Date Noted    Tobacco use 06/03/2021    Acute on chronic heart failure with preserved ejection fraction (HCC) 06/03/2021  Bilateral lower leg cellulitis 06/03/2021    Class 2 severe obesity due to excess calories with serious comorbidity and body mass index (BMI) of 39.0 to 39.9 in adult  06/03/2021    Bilateral lower extremity edema 05/16/2021    Primary hypertension 05/16/2021    Mixed hyperlipidemia 07/01/2018    Coronary artery disease due to calcified coronary lesion 07/01/2018    Arthritis 04/04/2018    Back pain 04/04/2018    Cataracts, bilateral 04/04/2018    Depression 04/04/2018    GERD (gastroesophageal reflux disease) 04/04/2018    Insomnia 04/04/2018    Multiple sclerosis (HCC) 04/04/2018    Restless leg syndrome 04/04/2018    Urinary incontinence 04/04/2018    Elevated coronary artery calcium score 04/01/2018     03/14/2018  Coronary Artery Calcium Score: 927.  Left main 0. RCA 395. LAD 263. Circumflex 269.  PDA 0. Diagonal branches 0. Marginal Branches 0         I reviewed and confirmed this patient's problem list, active medications, allergies, and past medical, social, family & tobacco histories.     Review of Systems  14 point review of systems negative except as above.    ROS    Vitals:    04/21/22 1342   BP: 138/66   BP Source: Arm, Left Upper   Pulse: 91   SpO2: 94%   O2 Device: None (Room air)   PainSc: Zero   Weight: 94.3 kg (208 lb)   Height: 157.5 cm (5' 2)     Body mass index is 38.04 kg/m?Marland Kitchen     Physical Exam  General Appearance: no acute distress  HEENT: EOMI, mucous membranes moist, oropharynx is clear  Neck Veins: neck veins are flat & not distended  Carotid Arteries: no bruits  Chest Inspection: chest is normal in appearance  Auscultation/Percussion: lungs clear to auscultation, no rales, rhonchi, or wheezing  Cardiac Rhythm: regular rhythm & normal rate  Cardiac Auscultation: Normal S1 & S2, no S3 or S4, no rub  Murmurs: no cardiac murmurs  Abdominal Exam: soft, non-tender, normal bowel sounds, no masses or bruits  Abdominal aorta: nonpalpable   Liver & Spleen: no organomegaly  Extremities: 2+ pitting lower extremity edema; palpable distal pulses  Skin: warm & intact  Neurologic Exam: oriented to time, place and person; no focal neurologic deficits       Cardiovascular Studies  05/27/21   2D + DOPPLER ECHO   Result Value Ref Range    BSA 2.04 m2    Referring Provider Terri Piedra, NP     LVIDD 4.0 3.8 - 5.2 cm    IVS 1.2 0.6 - 0.9 cm    PW 1.3 0.6 - 0.9 cm    LVIDS 2.4 2.2 - 3.5 cm    FS 40.00 28 - 44 %    Teichholtz 68.00 %    LA volume 37 22 - 52 mL    Sinus 3.2 2.4 - 3.6 cm    LV mass 176 67 - 162 g    LA size 3.5 2.7 - 3.8 cm    RWT 0.65 <=0.42    , with a mean gradient of 5 mmHg    AV peak velocity 1.5 m/s    Aortic valve area = 1.77 cm2    AV index (native) 0.87     E/A ratio 1.23     TDI lateral e' 0.130 m/s    LVOT diameter 1.6 cm    LVOT area 2.01 cm2  LVOT peak vel 1.3 m/s    LVOT peak VTI 30.0 cm    Ao VTI 34.0 cm    LVOT stroke volume 60.32 cm3    and a peak gradient of 9 mmHg    Lateral E/E' ratio 12.31     Right Ventricular Basal Diameter 3.1 2.5 - 4.1 cm    Right Ventricular Mid Diameter 2.1 1.9 - 3.5 cm    Right Atrial Area 12.2 <18 cm2    Right Heart Systolic TDI S' 0.1 m/s    Right Heart Systolic Mmode TAPSE 1.6 >1.7 cm    MV Peak E Vel PW 1.600 m/s    MV Peak A Vel 1.300 m/s    Interp Only Sonographer [facility] [place]     Left Atrium Index 18.14 16 - 34 mL/m2    Left Ventricle Mass Index 86 43 - 95 g/m2    Left Ventricle Diastolic Volume 71 46 - 106 mL    Left Ventricle Diastolic Volume Index 35 29 - 61 mL/m2    Left Ventricle Systolic Volume 18 14 - 42 mL    Left Ventricle Systolic Volume Index 9 8 - 24 mL/m2    TDI Medial e' 0.110 m/s    Medial E/E' ratio 14.55     RA PRESSURE 8     ECHO EF 75 %        Cardiovascular Health Factors  Vitals BP Readings from Last 3 Encounters:   04/21/22 138/66   07/04/21 126/66   06/03/21 118/68     Wt Readings from Last 3 Encounters:   04/21/22 94.3 kg (208 lb)   03/31/22 99.8 kg (220 lb)   07/04/21 90.8 kg (200 lb 3.2 oz)     BMI Readings from Last 3 Encounters:   04/21/22 38.04 kg/m?   03/31/22 40.24 kg/m?   07/04/21 36.62 kg/m?      Smoking Social History     Tobacco Use   Smoking Status Every Day    Packs/day: .5    Types: Cigarettes   Smokeless Tobacco Never   Tobacco Comments    Working on quiting      Lipid Profile Cholesterol   Date Value Ref Range Status   12/16/2021 145  Final     HDL   Date Value Ref Range Status   12/16/2021 42  Final     LDL   Date Value Ref Range Status   12/16/2021 75  Final     Triglycerides   Date Value Ref Range Status   12/16/2021 142  Final      Blood Sugar No results found for: HGBA1C  Glucose   Date Value Ref Range Status 07/22/2021 84 65 - 139 mg/dL Final     Comment:               Non-fasting reference interval        06/24/2021 121 (H) 70 - 105 Final   06/17/2021 92 65 - 99 mg/dL Final     Comment:                   Fasting reference interval        06/17/2021 92  Final   06/03/2021 104  Final        ASCVD Risk Assessment:     ASCVD 10-year risk calculated: The 10-year ASCVD risk score (Arnett DK, et al., 2019) is: 13.6%*    Values used to calculate the score:  Age: 60 years      Sex: Female      Is Non-Hispanic African American: No      Diabetic: No      Tobacco smoker: Yes      Systolic Blood Pressure: 138 mmHg      Is BP treated: Yes      HDL Cholesterol: 42 mg/dL*      Total Cholesterol: 145 mg/dL*      * - Cholesterol units were assumed for this score calculation     LDL 70-189, if ASCVD 10-y risk is >7.5%, high to moderate-intensity statin therapy is recommended  Diabetes with ASCVD 10-y risk >7.5%, high-intensity statin therapy is recommended.  Diabetes with ASCVD 10-y risk <7.5%, moderate-intensity statin therapy is recommended.      Current Medications (including today's revisions)   acetaminophen (TYLENOL) 500 mg tablet Take two tablets by mouth as Needed for Pain. Max of 4,000 mg of acetaminophen in 24 hours.    albuterol sulfate (PROAIR HFA) 90 mcg/actuation HFA aerosol inhaler Inhale two puffs by mouth into the lungs every 6 hours as needed.    alirocumab (PRALUENT PEN) 150 mg/mL injectable PEN Inject 1 mL under the skin every 14 days.    bumetanide (BUMEX) 2 mg tablet Take one tablet by mouth daily.    cholecalciferol (vitamin D3) (VITAMIN D3) 50 mcg (2,000 unit) tablet Take one tablet by mouth daily.    diphenhydrAMINE (BENADRYL) 25 mg capsule Take one capsule by mouth as Needed.    fingolimod (GILENYA) 0.5 mg Take one capsule by mouth daily.    fluticasone propionate (FLONASE) 50 mcg/actuation nasal spray, suspension Apply two sprays to each nostril as directed as Needed. Shake bottle gently before using. levothyroxine (SYNTHROID) 137 mcg tablet Take one tablet by mouth daily 30 minutes before breakfast.    lisinopril (PRINIVIL; ZESTRIL) 20 mg tablet Take one tablet by mouth daily.    metOLazone (ZAROXOLYN) 2.5 mg tablet Take one tablet by mouth daily as needed. Take prior to Bumex    MULTIVITAMIN PO Take 1 tablet by mouth daily.    pantoprazole DR (PROTONIX) 40 mg tablet Take one tablet by mouth daily.    potassium chloride (K-TAB) 10 mEq tablet Take two tablets by mouth twice daily. Take with a meal and a full glass of water. Take addition potassium as directed by cardiology    pramipexole (MIRAPEX) 0.5 mg tablet Take one tablet by mouth three times daily.    torsemide (DEMADEX) 20 mg tablet TAKE 2 TABLETS BY MOUTH TWICE A DAY (Patient taking differently: Patient has not been taking for a week)         Orpah Cobb MD  Cardiovascular Medicine.

## 2022-04-30 ENCOUNTER — Encounter: Admit: 2022-04-30 | Discharge: 2022-04-30 | Payer: MEDICARE | Primary: Primary Care

## 2022-05-04 MED FILL — PRALUENT PEN 150 MG/ML SC PNIJ: 150 mg/mL | SUBCUTANEOUS | 28 days supply | Qty: 2 | Fill #7 | Status: AC

## 2022-05-05 ENCOUNTER — Encounter: Admit: 2022-05-05 | Discharge: 2022-05-05 | Payer: MEDICARE | Primary: Primary Care

## 2022-06-10 ENCOUNTER — Encounter: Admit: 2022-06-10 | Discharge: 2022-06-10 | Payer: MEDICARE | Primary: Primary Care

## 2022-06-11 ENCOUNTER — Encounter: Admit: 2022-06-11 | Discharge: 2022-06-11 | Payer: MEDICARE | Primary: Primary Care

## 2022-06-12 ENCOUNTER — Encounter: Admit: 2022-06-12 | Discharge: 2022-06-12 | Payer: MEDICARE | Primary: Primary Care

## 2022-06-12 MED FILL — PRALUENT PEN 150 MG/ML SC PNIJ: 150 mg/mL | SUBCUTANEOUS | 28 days supply | Qty: 2 | Fill #8 | Status: AC

## 2022-07-27 ENCOUNTER — Encounter: Admit: 2022-07-27 | Discharge: 2022-07-27 | Payer: MEDICARE | Primary: Primary Care

## 2022-07-28 ENCOUNTER — Encounter: Admit: 2022-07-28 | Discharge: 2022-07-28 | Payer: MEDICARE | Primary: Primary Care

## 2022-07-28 MED FILL — PRALUENT PEN 150 MG/ML SC PNIJ: 150 mg/mL | SUBCUTANEOUS | 28 days supply | Qty: 2 | Fill #9 | Status: AC

## 2022-09-11 ENCOUNTER — Encounter: Admit: 2022-09-11 | Discharge: 2022-09-11 | Payer: MEDICARE | Primary: Primary Care

## 2022-09-17 ENCOUNTER — Encounter: Admit: 2022-09-17 | Discharge: 2022-09-17 | Payer: MEDICARE | Primary: Primary Care

## 2022-09-17 NOTE — Progress Notes
Records Request    [name] DOB: Dec 29, 1955    Medical records request for continuation of care:    Patient has appointment with Dr. Geronimo Boot    Please fax records to Cardiovascular Medicine [place] [facility] [phone]    Request records:    Please cloud images of CTA chest 08/05/22    Thank you,      Cardiovascular Medicine  [facility]  [address]  [place], [place] [address]  Phone:  [phone]  Fax:  [phone]

## 2022-09-18 ENCOUNTER — Encounter: Admit: 2022-09-18 | Discharge: 2022-09-18 | Payer: MEDICARE | Primary: Primary Care

## 2022-09-18 DIAGNOSIS — E039 Hypothyroidism, unspecified: Secondary | ICD-10-CM

## 2022-09-18 DIAGNOSIS — M549 Dorsalgia, unspecified: Secondary | ICD-10-CM

## 2022-09-18 DIAGNOSIS — G2581 Restless legs syndrome: Secondary | ICD-10-CM

## 2022-09-18 DIAGNOSIS — E782 Mixed hyperlipidemia: Secondary | ICD-10-CM

## 2022-09-18 DIAGNOSIS — K219 Gastro-esophageal reflux disease without esophagitis: Secondary | ICD-10-CM

## 2022-09-18 DIAGNOSIS — B999 Unspecified infectious disease: Secondary | ICD-10-CM

## 2022-09-18 DIAGNOSIS — I10 Essential (primary) hypertension: Secondary | ICD-10-CM

## 2022-09-18 DIAGNOSIS — R931 Abnormal findings on diagnostic imaging of heart and coronary circulation: Secondary | ICD-10-CM

## 2022-09-18 DIAGNOSIS — G35 Multiple sclerosis: Secondary | ICD-10-CM

## 2022-09-18 DIAGNOSIS — G4700 Insomnia, unspecified: Secondary | ICD-10-CM

## 2022-09-18 DIAGNOSIS — I2510 Atherosclerotic heart disease of native coronary artery without angina pectoris: Secondary | ICD-10-CM

## 2022-09-18 DIAGNOSIS — M1990 Unspecified osteoarthritis, unspecified site: Secondary | ICD-10-CM

## 2022-09-18 DIAGNOSIS — Z9981 Dependence on supplemental oxygen: Secondary | ICD-10-CM

## 2022-09-18 DIAGNOSIS — R32 Unspecified urinary incontinence: Secondary | ICD-10-CM

## 2022-09-18 DIAGNOSIS — F32A Depression: Secondary | ICD-10-CM

## 2022-09-18 DIAGNOSIS — H269 Unspecified cataract: Secondary | ICD-10-CM

## 2022-09-18 MED ORDER — METOLAZONE 2.5 MG PO TAB
2.5 mg | ORAL_TABLET | Freq: Two times a day (BID) | ORAL | 1 refills | 84.00000 days | Status: AC | PRN
Start: 2022-09-18 — End: ?

## 2022-09-18 NOTE — Patient Instructions
Thank you for visiting our office today.    We would like to make the following medication adjustments:      Metolazone twice daily 30 minutes before Bumex as needed      Otherwise continue the same medications as you have been doing.          We will be pursuing the following tests after your appointment today:       Orders Placed This Encounter    metOLazone (ZAROXOLYN) 2.5 mg tablet         We will plan to see you back in 6-7 months.  Please call us in the meantime with any questions or concerns.        Please allow 5-7 business days for our providers to review your results. All normal results will go to MyChart. If you do not have Mychart, it is strongly recommended to get this so you can easily view all your results. If you do not have mychart, we will attempt to call you once with normal lab and testing results. If we cannot reach you by phone with normal results, we will send you a letter.  If you have not heard the results of your testing after one week please give [place] a call.       Your Cardiovascular Medicine [place] Team Brett Canales, Pilar Jarvis, Shawna Orleans, and [place])  phone number is [phone].

## 2022-09-18 NOTE — Progress Notes
Records Request    Nicole Hopkins DOB: January 06, 1956    STAT    Medical records request for continuation of care:    Patient has appointment with Dr. Geronimo Boot.    Please fax records to Cardiovascular Medicine [place] [facility] [phone]    Request records:    Please cloud images of ECHO 08/04/22    Thank you,      Cardiovascular Medicine  [facility]  [address]  [place], [place] [address]  Phone:  [phone]  Fax:  [phone]

## 2022-09-22 ENCOUNTER — Encounter: Admit: 2022-09-22 | Discharge: 2022-09-22 | Payer: MEDICARE | Primary: Primary Care

## 2022-09-23 ENCOUNTER — Encounter: Admit: 2022-09-23 | Discharge: 2022-09-23 | Payer: MEDICARE | Primary: Primary Care

## 2022-09-23 MED ORDER — METOLAZONE 2.5 MG PO TAB
2.5 mg | ORAL_TABLET | Freq: Two times a day (BID) | ORAL | 1 refills | 84.00000 days | Status: AC | PRN
Start: 2022-09-23 — End: ?

## 2022-09-23 MED ORDER — PRALUENT PEN 150 MG/ML SC PNIJ
150 mg | SUBCUTANEOUS | 11 refills | 28.00000 days | Status: AC
Start: 2022-09-23 — End: ?
  Filled 2022-09-29: qty 2, 28d supply, fill #1

## 2022-09-23 NOTE — Telephone Encounter
09/23/2022 9:08 AM   Pt requested 90 day refill of metolazone be sent to [facility]. Sent per request.

## 2022-09-24 ENCOUNTER — Encounter: Admit: 2022-09-24 | Discharge: 2022-09-24 | Payer: MEDICARE | Primary: Primary Care

## 2022-09-25 ENCOUNTER — Encounter: Admit: 2022-09-25 | Discharge: 2022-09-25 | Payer: MEDICARE | Primary: Primary Care

## 2022-09-28 ENCOUNTER — Encounter: Admit: 2022-09-28 | Discharge: 2022-09-28 | Payer: MEDICARE | Primary: Primary Care

## 2022-09-29 ENCOUNTER — Encounter: Admit: 2022-09-29 | Discharge: 2022-09-29 | Payer: MEDICARE | Primary: Primary Care

## 2022-10-19 ENCOUNTER — Encounter: Admit: 2022-10-19 | Discharge: 2022-10-19 | Payer: MEDICARE | Primary: Primary Care

## 2022-10-28 ENCOUNTER — Encounter: Admit: 2022-10-28 | Discharge: 2022-10-28 | Payer: MEDICARE | Primary: Primary Care

## 2022-10-29 MED FILL — PRALUENT PEN 150 MG/ML SC PNIJ: 150 mg/mL | SUBCUTANEOUS | 28 days supply | Qty: 2 | Fill #2 | Status: AC

## 2022-11-24 ENCOUNTER — Encounter: Admit: 2022-11-24 | Discharge: 2022-11-24 | Payer: MEDICARE | Primary: Primary Care

## 2022-12-11 ENCOUNTER — Encounter: Admit: 2022-12-11 | Discharge: 2022-12-11 | Payer: MEDICARE | Primary: Primary Care

## 2022-12-13 ENCOUNTER — Encounter: Admit: 2022-12-13 | Discharge: 2022-12-13 | Payer: MEDICARE | Primary: Primary Care

## 2022-12-14 MED FILL — PRALUENT PEN 150 MG/ML SC PNIJ: 150 mg/mL | SUBCUTANEOUS | 28 days supply | Qty: 2 | Fill #3 | Status: AC

## 2023-01-11 ENCOUNTER — Encounter: Admit: 2023-01-11 | Discharge: 2023-01-11 | Payer: MEDICARE | Primary: Primary Care

## 2023-01-14 ENCOUNTER — Encounter: Admit: 2023-01-14 | Discharge: 2023-01-14 | Payer: MEDICARE | Primary: Primary Care

## 2023-01-15 ENCOUNTER — Encounter: Admit: 2023-01-15 | Discharge: 2023-01-15 | Payer: MEDICARE | Primary: Primary Care

## 2023-01-15 MED FILL — PRALUENT PEN 150 MG/ML SC PNIJ: 150 mg/mL | SUBCUTANEOUS | 28 days supply | Qty: 2 | Fill #4 | Status: AC

## 2023-02-16 ENCOUNTER — Encounter: Admit: 2023-02-16 | Discharge: 2023-02-16 | Payer: MEDICARE | Primary: Primary Care

## 2023-02-21 ENCOUNTER — Encounter: Admit: 2023-02-21 | Discharge: 2023-02-21 | Payer: MEDICARE | Primary: Primary Care

## 2023-02-21 MED FILL — PRALUENT PEN 150 MG/ML SC PNIJ: 150 mg/mL | SUBCUTANEOUS | 28 days supply | Qty: 2 | Fill #5 | Status: AC

## 2023-03-02 ENCOUNTER — Encounter: Admit: 2023-03-02 | Discharge: 2023-03-02 | Payer: MEDICARE | Primary: Primary Care

## 2023-03-02 MED ORDER — POTASSIUM CHLORIDE 10 MEQ PO TBER
20 meq | ORAL_TABLET | Freq: Two times a day (BID) | ORAL | 3 refills | 30.00000 days | Status: AC
Start: 2023-03-02 — End: ?

## 2023-03-15 ENCOUNTER — Encounter: Admit: 2023-03-15 | Discharge: 2023-03-15 | Payer: MEDICARE | Primary: Primary Care

## 2023-03-18 ENCOUNTER — Encounter: Admit: 2023-03-18 | Discharge: 2023-03-18 | Payer: MEDICARE | Primary: Primary Care

## 2023-03-25 ENCOUNTER — Encounter: Admit: 2023-03-25 | Discharge: 2023-03-25 | Payer: MEDICARE | Primary: Primary Care

## 2023-03-26 ENCOUNTER — Encounter: Admit: 2023-03-26 | Discharge: 2023-03-26 | Payer: MEDICARE | Primary: Primary Care

## 2023-03-26 MED FILL — PRALUENT PEN 150 MG/ML SC PNIJ: 150 mg/mL | SUBCUTANEOUS | 28 days supply | Qty: 2 | Fill #6 | Status: AC

## 2023-04-27 ENCOUNTER — Encounter: Admit: 2023-04-27 | Discharge: 2023-04-27 | Payer: MEDICARE | Primary: Primary Care

## 2023-04-28 ENCOUNTER — Encounter: Admit: 2023-04-28 | Discharge: 2023-04-28 | Payer: MEDICARE | Primary: Primary Care

## 2023-04-28 MED FILL — PRALUENT PEN 150 MG/ML SC PNIJ: 150 mg/mL | SUBCUTANEOUS | 28 days supply | Qty: 2 | Fill #7 | Status: AC

## 2023-06-05 ENCOUNTER — Encounter: Admit: 2023-06-05 | Discharge: 2023-06-05 | Payer: MEDICARE | Primary: Primary Care

## 2023-06-08 ENCOUNTER — Encounter: Admit: 2023-06-08 | Discharge: 2023-06-08 | Payer: MEDICARE | Primary: Primary Care

## 2023-06-09 ENCOUNTER — Encounter: Admit: 2023-06-09 | Discharge: 2023-06-09 | Payer: MEDICARE | Primary: Primary Care

## 2023-06-09 MED FILL — PRALUENT PEN 150 MG/ML SC PNIJ: 150 mg/mL | SUBCUTANEOUS | 28 days supply | Qty: 2 | Fill #8 | Status: AC

## 2023-07-05 ENCOUNTER — Encounter: Admit: 2023-07-05 | Discharge: 2023-07-05 | Payer: MEDICARE | Primary: Primary Care

## 2023-07-05 MED ORDER — PRALUENT PEN 150 MG/ML SC PNIJ
150 mg | SUBCUTANEOUS | 3 refills | 28.00000 days | Status: AC
Start: 2023-07-05 — End: ?
  Filled 2023-07-21: qty 6, 84d supply, fill #1

## 2023-07-20 ENCOUNTER — Encounter: Admit: 2023-07-20 | Discharge: 2023-07-20 | Payer: MEDICARE | Primary: Primary Care

## 2023-07-21 ENCOUNTER — Encounter: Admit: 2023-07-21 | Discharge: 2023-07-21 | Payer: MEDICARE | Primary: Primary Care

## 2023-10-28 ENCOUNTER — Encounter: Admit: 2023-10-28 | Discharge: 2023-10-28 | Payer: MEDICARE | Primary: Primary Care

## 2023-11-01 ENCOUNTER — Encounter: Admit: 2023-11-01 | Discharge: 2023-11-01 | Payer: MEDICARE | Primary: Primary Care

## 2023-11-01 MED FILL — PRALUENT PEN 150 MG/ML SC PNIJ: 150 mg/mL | SUBCUTANEOUS | 84 days supply | Qty: 6 | Fill #1 | Status: AC

## 2023-11-09 IMAGING — US ECHOCOMPL
1 series · 12 of 24 positions shown · non-contrast
Comparison: none

[Series 1: us echo 2d, complete · 71 acquisitions, 12 frames shown]
[im 4/71]
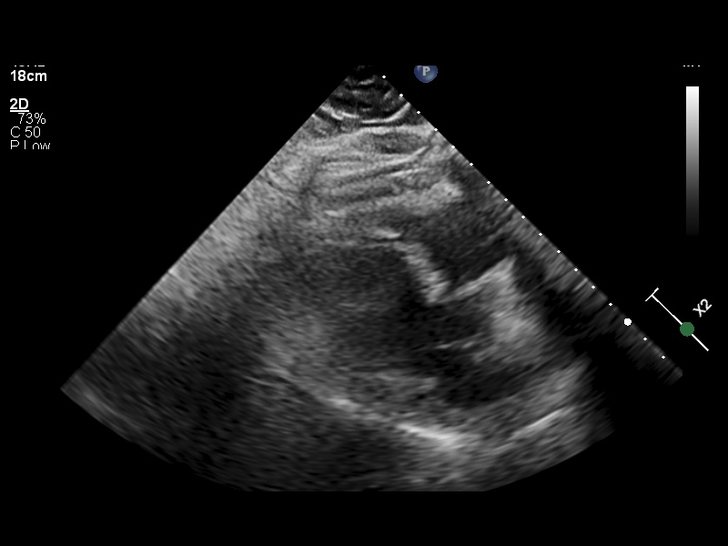
[im 7/71]
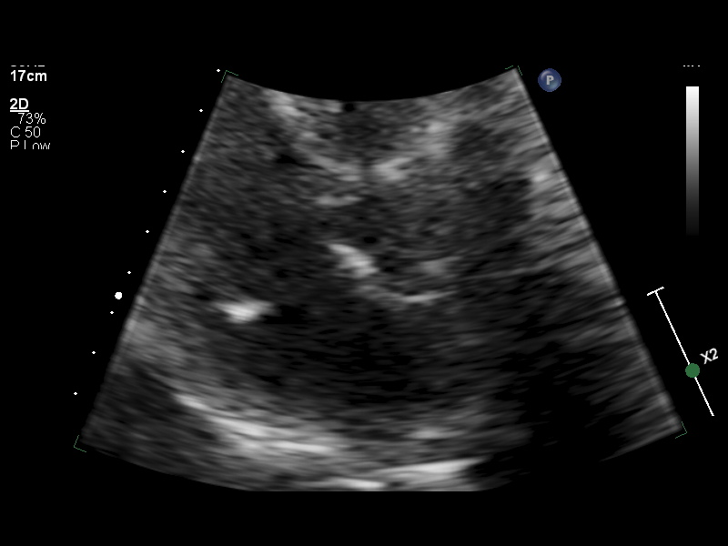
[im 16/71]
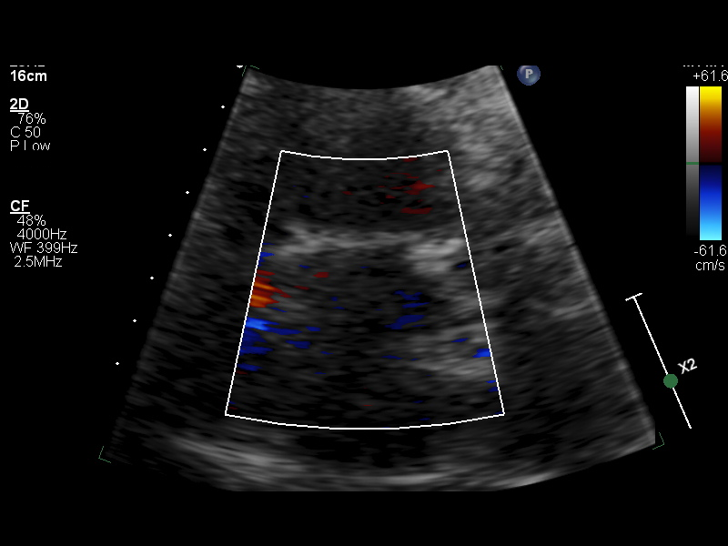
[im 19/71]
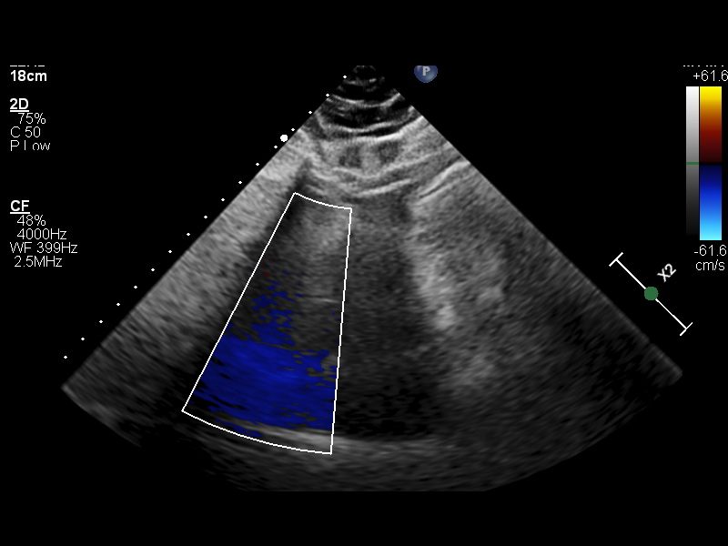
[im 25/71]
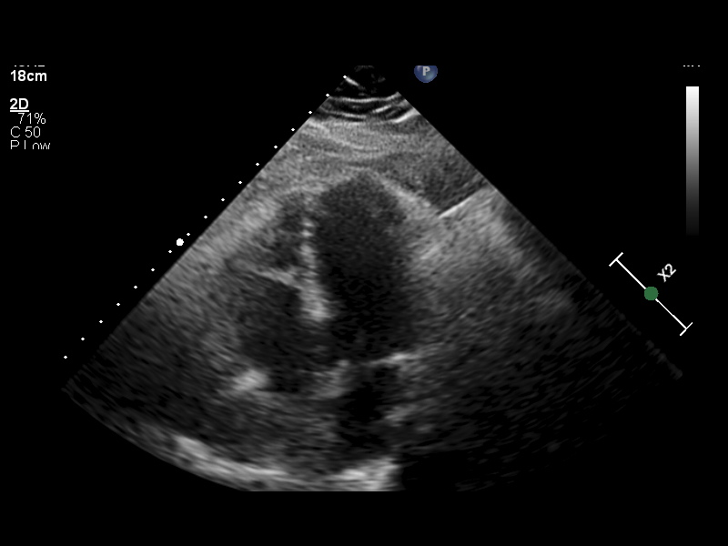
[im 34/71]
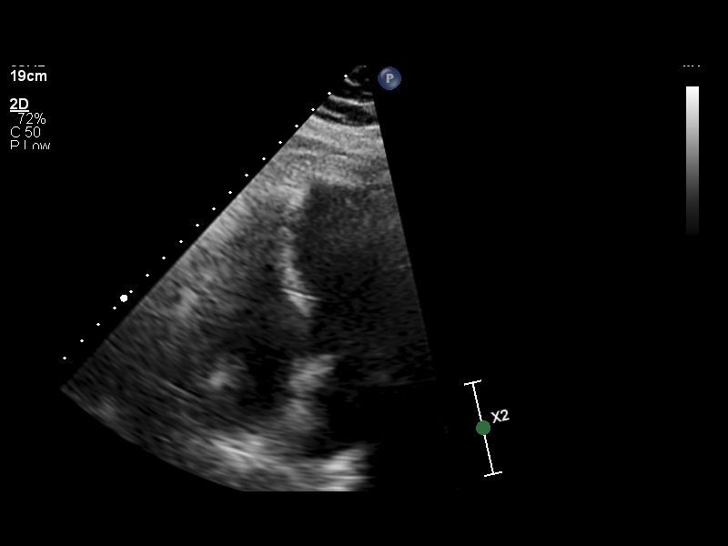
[im 40/71]
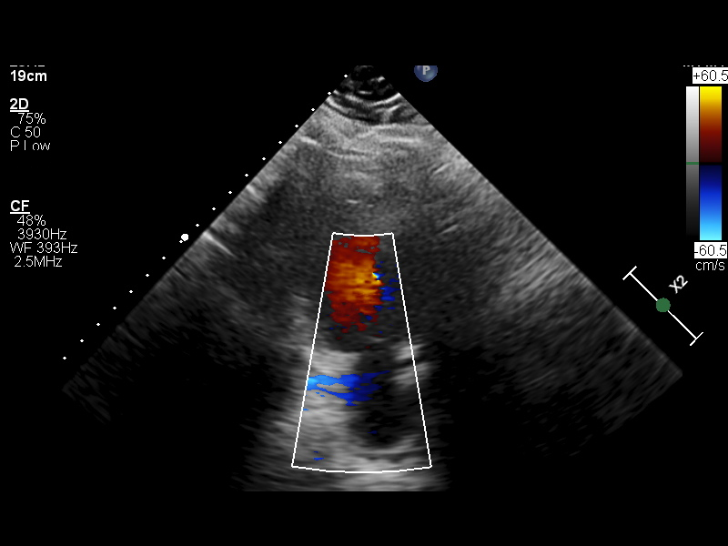
[im 43/71]
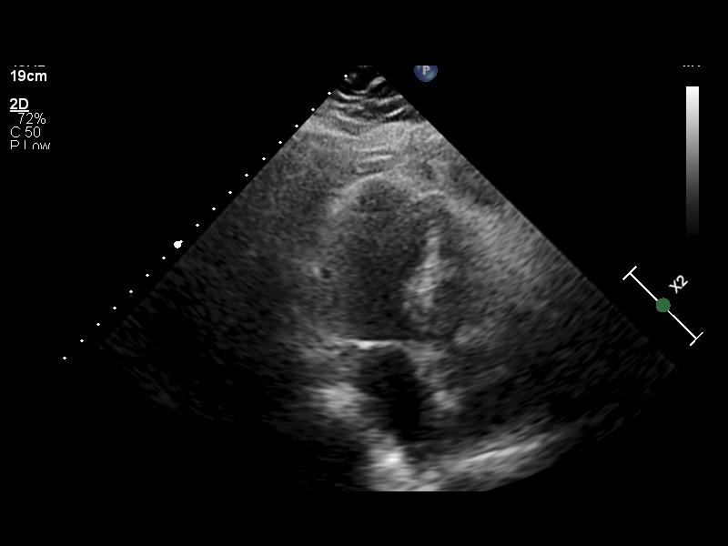
[im 52/71]
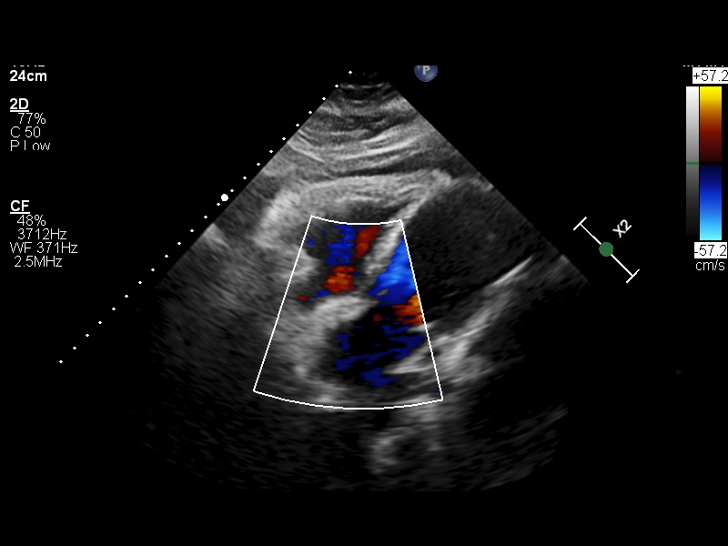
[im 58/71]
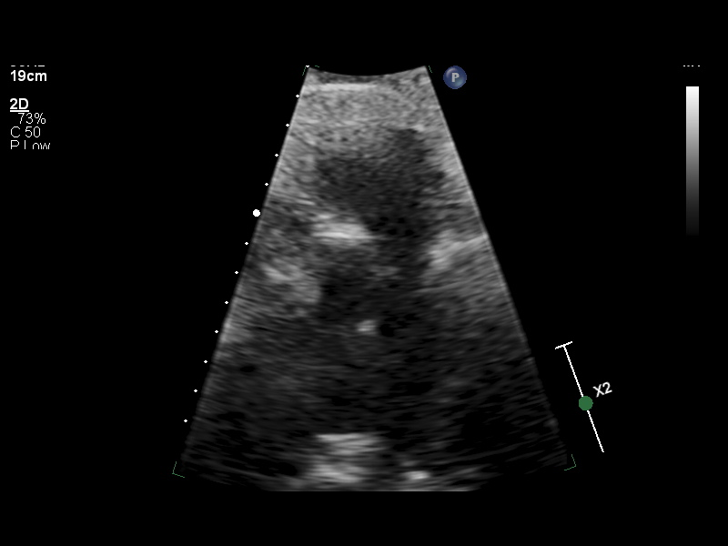
[im 64/71]
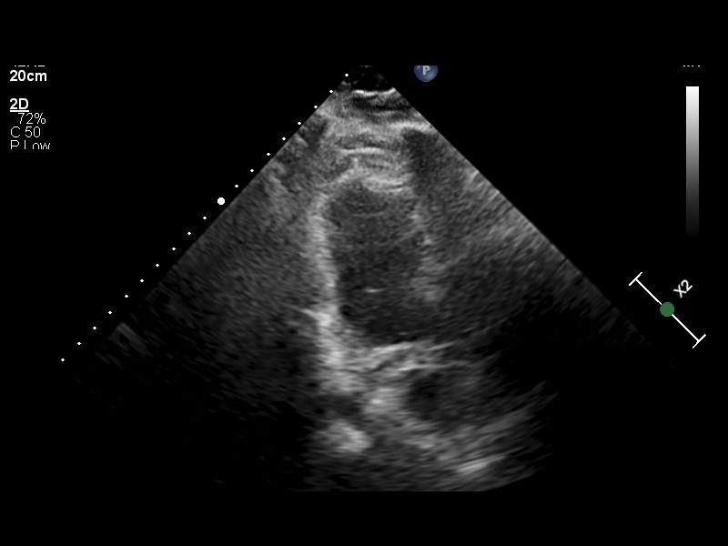
[im 71/71]
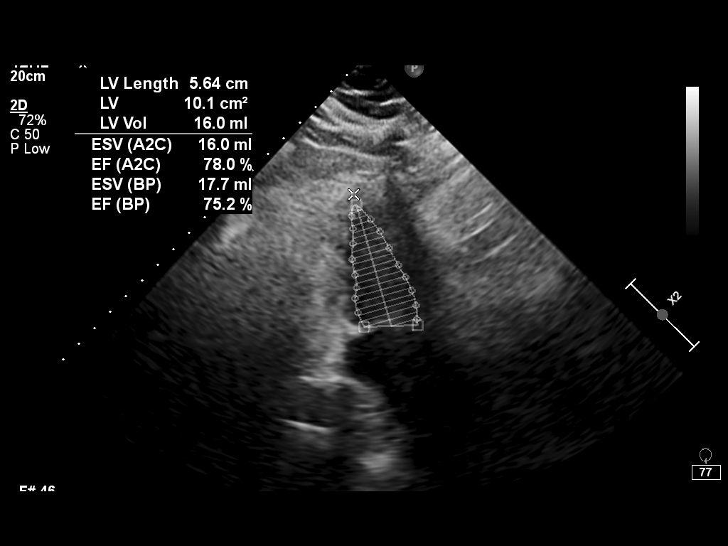

[12 of 24 positions shown; findings below may reference images not displayed]

05/27/21 -  2D + DOPPLER ECHO
Location Performed: [HOSPITAL]

Referring Provider: Runge, Sisi, NP
Fellow:
Location of Interp:
Sonographer: External Staff  [MAKRUS BAKUL]-Mompremier, Petros

Indications: Hypertension     Coronary artery disease     HLD, BLE Edema

Unable to obtain IV access to administer contrast. Technically difficult study with poor endocardial
visualization.

Vitals
Height   Weight   BSA (Calculated)   BP   Comments
157.5 cm (5' 2")   95 kg (209 lb 7 oz)   2.04   128/75

Interpretation Summary
Technically difficult study
The left ventricular size is normal. Concentric remodeling. The left ventricular systolic function
is hyperdynamic. The visually estimated ejection fraction is 75%. This study is insufficient to
assess segmental wall motion. Consider repeat study with echo contrast if clinically indicated.
The right ventricular size is normal. The right ventricular systolic function is normal.
Normal biatrial size.
No hemodynamically significant valvular disease.
No pericardial effusion.
Compared with study dated 02/06/2019, no significant change is noted.

Echocardiographic Findings
Left Ventricle   The left ventricular size is normal. Concentric remodeling. The left ventricular
systolic function is hyperdynamic. The visually estimated ejection fraction is 75%.
Right Ventricle   The right ventricular size is normal. The right ventricular systolic function is
normal. The pulmonary artery pressure could not be estimated due to inadequate tricuspid
regurgitation signal.
Left Atrium   Normal size.
Right Atrium   Normal size.
IVC/SVC   Elevated central venous pressure (5-10 mm Hg).
Mitral Valve   Non-specific thickening. No regurgitation. There is mitral annular calcification
without stenosis.
Tricuspid Valve   Normal valve structure. No stenosis. No regurgitation.
Aortic Valve   The aortic valve was not well seen. The valve has focal thickening. No stenosis. No
regurgitation.
Pulmonary   The pulmonic valve was not seen well but no Doppler evidence of stenosis.
Aorta   The aortic root and ascending aorta are normal in size.
Pericardium   Pericardial fat pad present. No pericardial effusion.

Left Heart 2D Measurements (Normal Ranges)
EF (Visual)
75 %
LVIDD
4.0 cm  (Range: 3.8 - 5.2)
LVIDS
2.4 cm  (Range: 2.2 - 3.5)
IVS
1.2 cm  (Range: 0.6 - 0.9)
LV PW
1.3 cm  (Range: 0.6 - 0.9)
LA Size
3.5 cm  (Range: 2.7 - 3.8)

Right Heart 2D   M-Mode Measurements (Normal Ranges) (Range)
RV Basal Dia
3.1 cm  (2.5 - 4.1)
RV Mid Dia
2.1 cm  (1.9 - 3.5)
[REDACTED].2 cm2  (<18)
M-Mode TAPSE
1.6 cm  (>1.7)

Left Heart 2D Addnl Measurements (Normal Ranges)
LV Systolic Vol
18 mL  (Range: 14 - 42)
LV Systolic Vol Index
9 mL/m2  (Range: 8 - 24)
LV Diastolic Vol
71 mL  (Range: 46 - 106)
LV Diastolic Vol Index
35 mL/m2  (Range: 29 - 61)
LA Vol
37 mL  (Range: 22 - 52)
LA Vol Index
18.14 mL/m2  (Range: 16 - 34)
LV Mass
176 g  (Range: 67 - 162)
LV Mass Index
86 g/m2  (Range: 43 - 95)
RWT
0.65  (Range: <=0.42)

Aortic Root Measurements (Normal Ranges)
Sinus
3.2 cm  (Range: 2.4 - 3.6)

Doppler (Spectral and Color Flow)
Aortic valve area
1.77 cm2
Aortic valve mean gradient
Aortic valve peak gradient
Aortic valve peak velocity
1.5 m/s
Aortic valve velocity ratio

Tech Notes:

unsuccessful IV attempt. JL

## 2024-02-08 ENCOUNTER — Encounter: Admit: 2024-02-08 | Discharge: 2024-02-08 | Payer: MEDICARE | Primary: Primary Care

## 2024-02-09 ENCOUNTER — Encounter: Admit: 2024-02-09 | Discharge: 2024-02-09 | Payer: MEDICARE | Primary: Primary Care

## 2024-02-09 MED FILL — PRALUENT PEN 150 MG/ML SC PNIJ: 150 mg/mL | SUBCUTANEOUS | 84 days supply | Qty: 6 | Fill #2 | Status: AC

## 2024-03-16 ENCOUNTER — Encounter: Admit: 2024-03-16 | Discharge: 2024-03-16 | Payer: MEDICARE | Primary: Primary Care

## 2024-03-17 ENCOUNTER — Encounter: Admit: 2024-03-17 | Discharge: 2024-03-17 | Payer: MEDICARE | Primary: Primary Care

## 2024-03-28 ENCOUNTER — Encounter: Admit: 2024-03-28 | Discharge: 2024-03-28 | Payer: MEDICARE | Primary: Primary Care

## 2024-04-18 ENCOUNTER — Encounter: Admit: 2024-04-18 | Discharge: 2024-04-18 | Payer: MEDICARE | Primary: Primary Care

## 2024-04-19 ENCOUNTER — Encounter: Admit: 2024-04-19 | Discharge: 2024-04-19 | Payer: MEDICARE | Primary: Primary Care

## 2024-04-20 ENCOUNTER — Encounter: Admit: 2024-04-20 | Discharge: 2024-04-20 | Payer: MEDICARE | Primary: Primary Care
